# Patient Record
Sex: Female | Born: 1960
Health system: Southern US, Community
[De-identification: ages and names within clinical notes are randomized; demographics above are authoritative.]

## PROBLEM LIST (undated history)

## (undated) DIAGNOSIS — R059 Cough, unspecified: Secondary | ICD-10-CM

## (undated) DIAGNOSIS — R1110 Vomiting, unspecified: Secondary | ICD-10-CM

## (undated) DIAGNOSIS — R112 Nausea with vomiting, unspecified: Secondary | ICD-10-CM

## (undated) DIAGNOSIS — E669 Obesity, unspecified: Secondary | ICD-10-CM

## (undated) DIAGNOSIS — R6883 Chills (without fever): Secondary | ICD-10-CM

## (undated) DIAGNOSIS — R05 Cough: Secondary | ICD-10-CM

## (undated) DIAGNOSIS — F419 Anxiety disorder, unspecified: Secondary | ICD-10-CM

## (undated) DIAGNOSIS — R634 Abnormal weight loss: Secondary | ICD-10-CM

## (undated) DIAGNOSIS — R531 Weakness: Secondary | ICD-10-CM

## (undated) DIAGNOSIS — Z9889 Other specified postprocedural states: Secondary | ICD-10-CM

## (undated) HISTORY — DX: Chills (without fever): R68.83

## (undated) HISTORY — DX: Cough: R05

## (undated) HISTORY — DX: Abnormal weight loss: R63.4

## (undated) HISTORY — DX: Obesity, unspecified: E66.9

## (undated) HISTORY — DX: Cough, unspecified: R05.9

## (undated) HISTORY — DX: Weakness: R53.1

## (undated) HISTORY — DX: Anxiety disorder, unspecified: F41.9

## (undated) HISTORY — DX: Vomiting, unspecified: R11.10

## (undated) HISTORY — PX: COLONOSCOPY: SHX174

---

## 1999-05-02 ENCOUNTER — Other Ambulatory Visit: Admission: RE | Admit: 1999-05-02 | Discharge: 1999-05-02 | Payer: Self-pay | Admitting: Family Medicine

## 2001-01-13 ENCOUNTER — Other Ambulatory Visit: Admission: RE | Admit: 2001-01-13 | Discharge: 2001-01-13 | Payer: Self-pay | Admitting: Internal Medicine

## 2002-07-30 ENCOUNTER — Other Ambulatory Visit: Admission: RE | Admit: 2002-07-30 | Discharge: 2002-07-30 | Payer: Self-pay | Admitting: Obstetrics and Gynecology

## 2002-12-31 ENCOUNTER — Encounter (INDEPENDENT_AMBULATORY_CARE_PROVIDER_SITE_OTHER): Payer: Self-pay

## 2002-12-31 ENCOUNTER — Inpatient Hospital Stay (HOSPITAL_COMMUNITY): Admission: RE | Admit: 2002-12-31 | Discharge: 2003-01-02 | Payer: Self-pay | Admitting: Obstetrics and Gynecology

## 2004-01-29 HISTORY — PX: ABDOMINAL HYSTERECTOMY: SHX81

## 2004-07-13 ENCOUNTER — Other Ambulatory Visit: Admission: RE | Admit: 2004-07-13 | Discharge: 2004-07-13 | Payer: Self-pay | Admitting: Obstetrics and Gynecology

## 2006-05-22 ENCOUNTER — Other Ambulatory Visit: Admission: RE | Admit: 2006-05-22 | Discharge: 2006-05-22 | Payer: Self-pay | Admitting: Gynecology

## 2006-06-09 ENCOUNTER — Ambulatory Visit (HOSPITAL_COMMUNITY): Admission: RE | Admit: 2006-06-09 | Discharge: 2006-06-09 | Payer: Self-pay | Admitting: Obstetrics and Gynecology

## 2006-08-05 ENCOUNTER — Ambulatory Visit (HOSPITAL_COMMUNITY): Admission: RE | Admit: 2006-08-05 | Discharge: 2006-08-05 | Payer: Self-pay | Admitting: General Surgery

## 2006-08-11 ENCOUNTER — Encounter: Admission: RE | Admit: 2006-08-11 | Discharge: 2006-08-11 | Payer: Self-pay | Admitting: General Surgery

## 2006-08-28 ENCOUNTER — Ambulatory Visit (HOSPITAL_BASED_OUTPATIENT_CLINIC_OR_DEPARTMENT_OTHER): Admission: RE | Admit: 2006-08-28 | Discharge: 2006-08-28 | Payer: Self-pay | Admitting: General Surgery

## 2006-08-31 ENCOUNTER — Ambulatory Visit: Payer: Self-pay | Admitting: Internal Medicine

## 2006-11-17 ENCOUNTER — Encounter: Admission: RE | Admit: 2006-11-17 | Discharge: 2007-02-15 | Payer: Self-pay | Admitting: General Surgery

## 2006-12-02 ENCOUNTER — Ambulatory Visit (HOSPITAL_COMMUNITY): Admission: RE | Admit: 2006-12-02 | Discharge: 2006-12-03 | Payer: Self-pay | Admitting: *Deleted

## 2006-12-02 HISTORY — PX: LAPAROSCOPIC GASTRIC BANDING: SHX1100

## 2007-09-24 ENCOUNTER — Other Ambulatory Visit: Admission: RE | Admit: 2007-09-24 | Discharge: 2007-09-24 | Payer: Self-pay | Admitting: Gynecology

## 2008-11-17 ENCOUNTER — Other Ambulatory Visit: Admission: RE | Admit: 2008-11-17 | Discharge: 2008-11-17 | Payer: Self-pay | Admitting: Gynecology

## 2008-11-17 ENCOUNTER — Encounter: Payer: Self-pay | Admitting: Women's Health

## 2008-11-17 ENCOUNTER — Ambulatory Visit: Payer: Self-pay | Admitting: Women's Health

## 2009-05-08 ENCOUNTER — Ambulatory Visit (HOSPITAL_COMMUNITY): Admission: RE | Admit: 2009-05-08 | Discharge: 2009-05-08 | Payer: Self-pay | Admitting: Gynecology

## 2009-12-01 ENCOUNTER — Other Ambulatory Visit: Admission: RE | Admit: 2009-12-01 | Discharge: 2009-12-01 | Payer: Self-pay | Admitting: Gynecology

## 2009-12-01 ENCOUNTER — Ambulatory Visit: Payer: Self-pay | Admitting: Women's Health

## 2010-05-17 ENCOUNTER — Other Ambulatory Visit: Payer: Self-pay | Admitting: Women's Health

## 2010-05-17 DIAGNOSIS — Z1231 Encounter for screening mammogram for malignant neoplasm of breast: Secondary | ICD-10-CM

## 2010-06-12 NOTE — Op Note (Signed)
NAMEANGELI, [name]                ACCOUNT NO.:  [id number]   MEDICAL RECORD NO.:  [id number]          PATIENT TYPE:  OIB   LOCATION:  1233                         FACILITY:  [facility]   PHYSICIAN:  Sharlet Salina T. Hoxworth, M.D.DATE OF BIRTH:  May 06, 1960   DATE OF PROCEDURE:  12/02/2006  DATE OF DISCHARGE:                               OPERATIVE REPORT   PREOPERATIVE DIAGNOSIS:  Morbid obesity.   POSTOPERATIVE DIAGNOSES:  1. Morbid obesity.  2. Hiatal hernia.   SURGICAL PROCEDURES:  Placement of laparoscopic adjustable gastric band  with hiatal hernia repair.   SURGEON:  Lorne Skeens. Hoxworth, M.D.   ASSISTANT:  Ovidio Kin, M.D.   ANESTHESIA:  General.   BRIEF HISTORY:  Ms. Cupples is a 50 year old female with a long history  of morbid obesity unresponsive to medical management and comorbidities  of sleep apnea, hyperlipidemia and stress urinary incontinence.  After  extensive preoperative workup and discussion detailed elsewhere, we have  elected to proceed with placement of laparoscopic adjustable gastric  band.  Upper GI series indicates a very small hiatal hernia.   DESCRIPTION OF OPERATION:  The patient was brought to the operating room  and placed in the supine position on the operating table and general  endotracheal anesthesia was induced.  She was given preoperative  antibiotics.  The abdomen was widely sterilely prepped and draped.  PAS  were placed.  She received subcutaneous heparin preoperatively.  Correct  patient and procedure were verified.  Access was obtained in the left  subcostal space with an 11 mm OptiVu trocar and pneumoperitoneum  established without difficulty.  There was no evidence of trocar injury.  Under direct vision, a 15 mm trocar was placed in the right para xiphoid  area just beneath the falciform ligament, an 11 mm trocar in the right  midabdomen, an 11 mm trocar just to the left and above the umbilicus for  the camera.  Through a 5 mm subxiphoid  site the [facility] retractor was  placed.  The left lobe of the liver elevated with excellent exposure of  the upper stomach and hiatus.  Finally a 5 mm trocar was placed in the  left flank.  Initially the peritoneum at the angle of His was incised  and dissection carried bluntly down onto the left crus.  Following this,  pars flaccida was divided in avascular area and the base of the right  crus identified.  A small opening was made in the retroperitoneum at the  area of crossing fat and the finger dissector easily passed into the  space retrogastric and deployed up to the previously dissected area at  the angle of His without difficulty.  A flushed APS band system was  introduced through the 15 mm trocar and then the tubing brought back  behind the stomach with the finger dissector.  At this point the Ewald  tube was passed into the stomach and the balloon inflated to 15 mL.  It  was pulled back against the hiatus.  Initially there was some  resistance, but then it popped through the hiatus.  Elected to  go ahead  and repair her small hiatal hernia.  The peritoneum was incised along  the right crus just above the band tubing and dissection was carried  bluntly back behind the esophagus and esophagus elevated and esophageal  fat pad taken down out of the mediastinum and the left crus identified.  This was a small hiatal hernia.  A single 0 Ethibond suture was placed  between the crura posteriorly, but above the dissection plane of the  band and this was tied with a tie knot.  This appeared to close the  hiatus back down to a normal size.  The Ewald tube was again introduced  in the stomach.  The balloon inflated to 15 mL and this time it pulled  back snugly against the crural repair.  The balloon was deflated, the  Ewald tube withdrawn back into the esophagus and the band was pulled  back behind the stomach.  The Ewald tube was placed back into the  stomach and then the band was locked  without any undue tension.  The old  tube was removed.  Holding the band tubing firmly toward the patient's  feet beginning to the angle of His, the fundus of stomach was imbricated  up over the band to the small gastric pouch with three interrupted 2-0  Ethibond sutures.  The band appeared to lie in nice normal position.  There was no evidence of bleeding or trocar injury.  The Nathanson  retractor was removed.  The band tubing brought out through the right  mid abdominal site and all CO2 evacuated and trocars removed.  The right  mid abdominal incision was lengthened somewhat and dissection was  carried down with cautery to the anterior fascia which was cleared for  placement of the port.  The tubing was trimmed and attached to the port.  The port was then sutured to the anterior abdominal wall with four  interrupted 2-0 Prolene sutures and was lying nice and flat against the  fascia with the tubing curving smoothly into the abdomen.  This incision  was irrigated and hemostasis assured.  The subcu at the port site was  closed with running 3-0 Vicryl.  Skin incisions were closed with running  or interrupted 4-0 Monocryl and Dermabond.  Sponge and needle counts  were correct.  The patient was taken to recovery in good condition.      Lorne Skeens. Hoxworth, M.D.  Electronically Signed     BTH/MEDQ  D:  12/02/2006  T:  12/03/2006  Job:  [id number]

## 2010-06-12 NOTE — Procedures (Signed)
[name], [name]                ACCOUNT NO.:  [id number]   MEDICAL RECORD NO.:  [id number]          PATIENT TYPE:  OUT   LOCATION:  SLEEP CENTER                 FACILITY:  [facility]   PHYSICIAN:  Clinton D. Maple Hudson, MD, FCCP, FACPDATE OF BIRTH:  1960/03/26   DATE OF STUDY:  08/28/2006                            NOCTURNAL POLYSOMNOGRAM   REFERRING PHYSICIAN:  Sharlet Salina T. Hoxworth, M.D.   INDICATION FOR STUDY:  Hypersomnia with sleep apnea.   EPWORTH SLEEPINESS SCORE:  9/24, BMI is 51, weight 309 pounds.   MEDICATIONS:  Home medication Effexor.   The diagnostic NPSG protocol was requested.   SLEEP ARCHITECTURE:  Total sleep time 280 minutes with sleep efficiency  68%.  Stage I was 14%, stage II 66%, stage III 3%, REM 17% of total  sleep time.  Sleep latency 65 minutes, REM latency 277 minutes, awake  after sleep onset 64 minutes, arousal index 22.3.   RESPIRATORY DATA:  Diagnostic NPSG protocol.  Apnea-hypopnea index (AHI,  RDI) 24.2 obstructive events per hour, indicating moderate obstructive  sleep apnea/hypopnea syndrome.  There were 16 obstructive apneas and 97  hypopneas.  Events were positional,  strongly associated with supine  sleep position.  REM AHI 54.2.   OXYGEN DATA:  Moderate snoring with oxygen desaturation to a nadir of  83%.  Mean oxygen saturation through the study was 93% on room air.   CARDIAC DATA:  Normal sinus rhythm.   MOVEMENT-PARASOMNIA:  No significant limb jerk or movement disturbance.  Bathroom x1.   IMPRESSIONS-RECOMMENDATIONS:  1. Moderate obstructive sleep apnea/hypopnea syndrome, apnea-hypopnea      index 24.2 per hour with events mostly associated with supine sleep      position.  2. Moderate snoring with oxygen desaturation to a nadir of 83%.  3. CPAP titration was not requested.  Empiric CPAP at 10 centimeters      of water pressure would be a reasonable choice at time of surgery,      with general anesthesia, if formal CPAP titration is not  obtained.      Clinton D. Maple Hudson, MD, [facility], FACP  Diplomate, Biomedical engineer of Sleep Medicine  Electronically Signed     CDY/MEDQ  D:  08/31/2006 09:54:25  T:  08/31/2006 12:35:18  Job:  [id number]

## 2010-06-15 NOTE — H&P (Signed)
NAME:  [name], [name]                            ACCOUNT NO.:  [id number]   MEDICAL RECORD NO.:  [id number]                   PATIENT TYPE:   LOCATION:                                       FACILITY:   PHYSICIAN:  Guy Sandifer. Arleta Creek, M.D.           DATE OF BIRTH:   DATE OF ADMISSION:  12/31/2002  DATE OF DISCHARGE:                                HISTORY & PHYSICAL   CHIEF COMPLAINT:  Menorrhagia.   HISTORY OF PRESENT ILLNESS:  This patient is a 50 year old married white  female, G2, P2, who has increasingly heavy menses. She has had several  episodes of heavy, debilitating menstrual cramps followed  by the passage of  large blood clots. An ultrasound with sonohistogram in my office reveals a  uterus measuring 8.7 x 6.0 x 6.1 cm with a 6.6 mass consistent with  leiomyomata which abuts the endometrium. A 2-cm simple left ovarian cyst was  also noted in August. After a discussion of the options, the patient is  being admitted for total abdominal hysterectomy and removal of 1 or both  ovaries only if distinctly abnormal. The potential risks and complications  of the procedure have been discussed  preoperatively.   PAST MEDICAL HISTORY:  Severe cervical dysplasia, status post cold knife  conization in 1995.   PAST SURGICAL HISTORY:  As above.   FAMILY HISTORY:  Diabetes in mother and maternal grandfather. Stomach  cancer in grandmother. Heart disease in maternal grandmother. Endocrine  abnormality in mother.   SOCIAL HISTORY:  The patient denies tobacco, alcohol or drug abuse.   MEDICATIONS:  None.   ALLERGIES:  No known drug allergies.   REVIEW OF SYSTEMS:  NEUROLOGIC:  Denies headache. CARDIAC:  Denies chest  pain. PULMONARY: Denies shortness of breath. GI: Denies  recent  changes in  bowel habits.   PHYSICAL EXAMINATION:  GENERAL:  Height 5 feet, 4-3/4 inches, weight 271  pounds.  VITAL SIGNS:  Blood pressure 124/72.  HEENT:  Without thyromegaly.  LUNGS:  Clear to  auscultation.  HEART:  Regular rate and rhythm.  BACK:  Without CVA tenderness.  BREASTS:  Without masses, tracts, discharge.  ABDOMEN:  Obese, soft, nontender without masses.  PELVIC:  Vulva, vagina and cervix without lesion. Uterus is retroverted with  the examination  compromised by patient habitus. Adnexa no palpable masses.  EXTREMITIES: Neurological examination grossly within normal limits.   ASSESSMENT:  Uterine leiomyomata and secondary menorrhagia.   PLAN:  Total abdominal hysterectomy with possible bilateral salpingo-  oophorectomy.                                               Guy Sandifer Arleta Creek, M.D.    JET/MEDQ  D:  12/27/2002  T:  12/27/2002  Job:  [id number]

## 2010-06-15 NOTE — Discharge Summary (Signed)
[name], TUFTE                          ACCOUNT NO.:  [id number]   MEDICAL RECORD NO.:  [id number]                   PATIENT TYPE:  INP   LOCATION:  9311                                 FACILITY:  [facility]   PHYSICIAN:  Guy Sandifer. Arleta Creek, M.D.           DATE OF BIRTH:  06/30/1960   DATE OF ADMISSION:  12/31/2002  DATE OF DISCHARGE:  01/02/2003                                 DISCHARGE SUMMARY   ADMISSION DIAGNOSIS:  Uterine leiomyomata.   DISCHARGE DIAGNOSIS:  Uterine leiomyomata.   PROCEDURE:  On December 31, 2002 total abdominal hysterectomy.   REASON FOR ADMISSION:  The patient is a 50 year old married white female, G  2, P 2 having increasingly heavy menses.  Details dictated in History and  Physical.  She is admitted for surgical management.   HOSPITAL COURSE:  The patient is taken to the operating room and undergoes  the above procedure.  On the evening of surgery, she has good pain relief.  She has been up in a chair.  Vital signs are stable, she is afebrile with  good urine output.  On the first postoperative day, she has good pain  relief.  She is having some itching from the morphine. She has not yet  passed flatus as of the evening of January 01, 2003 but she is tolerating  liquids well and ambulating.  She remains  afebrile with white count of 9.1,  hemoglobin 11.7.  On the day of discharge, she is passing flatus,  ambulating, tolerating regular diet.  She is having itching on the Percocet.  Vital signs are stable.  Temperature is 100.9, retake was 97.5.  Lungs are  clear and incision is healing well.  Staples are removed.   CONDITION ON DISCHARGE:  Good.   DIET:  Regular as tolerated.   ACTIVITY:  No lifting, no operation of automobiles, no vaginal entry.   DISCHARGE INSTRUCTIONS:  She is to call the office for problems including  but not limited to heavy vaginal bleeding, temperature of 101 degrees,  persistent nausea and vomiting or increasing pain.  Follow  up is in the  office in two weeks.   DISCHARGE MEDICATIONS:  1. Demerol 50 mg, dispense #30, one to two p.o. q.6 h. p.r.n.  2. Ibuprofen 600 mg q.6 h. p.r.n.  3. Multivitamin daily.                                               Guy Sandifer Arleta Creek, M.D.    JET/MEDQ  D:  01/02/2003  T:  01/02/2003  Job:  [id number]

## 2010-06-15 NOTE — Op Note (Signed)
[name], [name]                          ACCOUNT NO.:  [id number]   MEDICAL RECORD NO.:  [id number]                   PATIENT TYPE:  INP   LOCATION:  9399                                 FACILITY:  [facility]   PHYSICIAN:  Guy Sandifer. Arleta Creek, M.D.           DATE OF BIRTH:  January 26, 1961   DATE OF PROCEDURE:  12/31/2002  DATE OF DISCHARGE:                                 OPERATIVE REPORT   PREOPERATIVE DIAGNOSIS:  Uterine leiomyomata.   POSTOPERATIVE DIAGNOSIS:  Uterine leiomyomata.   PROCEDURE:  Total abdominal hysterectomy.   SURGEON:  Guy Sandifer. Henderson Cloud, M.D.   ASSISTANT:  Raynald Kemp, M.D.   ANESTHESIA:  General with endotracheal intubation, Dorinda Hill T. Pamalee Leyden, M.D.   ESTIMATED BLOOD LOSS:  300 mL.   SPECIMENS:  Uterus and cervix.   INDICATIONS AND CONSENT:  This patient is a 50 year old married white  female, G2, P2, with increasingly heavy menstrual periods.  Details are  dictated in the history and physical.  Total abdominal hysterectomy and  removal of one or both ovaries only if distinctly abnormal has been  discussed.  Potential risks and complications have been reviewed, including  but not limited to infection, bowel or bladder or ureteral damage, bleeding  requiring transfusion of blood products with possible transfusion reaction,  HIV and hepatitis acquisition, DVT, PE, pneumonia, fistula formation,  postoperative dyspareunia, and postoperative pain.  All questions have been  answered and consent is signed on the chart.   FINDINGS:  The uterus has an approximately 7 cm posterior fundal fibroid.  Ovaries are normal bilaterally.   DESCRIPTION OF PROCEDURE:  The patient was taken to the operating room and  placed in the dorsal supine position, where general anesthesia is induced  via endotracheal intubation.  She is then prepped abdominally and vaginally,  a Foley catheter is placed in the bladder as a drain, and she is draped in a  sterile fashion.  The skin is  entered through a Pfannenstiel incision over  the previous scars and dissection is carried out in layers to the  peritoneum, which is extended superiorly and inferiorly.  A Balfour  retractor is placed, a bladder blade is placed, and the bowel is packed  away.  Uterus is grasped and elevated with a thyroid tenaculum.  Several  filmy adhesions of the right ovary to the uterus are taken down sharply.  Kelly clamps are placed on the proximal ligament.  The left round ligament  is then ligated with 0 Monocryl and taken down with cautery.  All suture  will be 0 Monocryl unless otherwise designated.  The anterior leaf of the  broad ligament is taken down and the posterior leaf is sharply perforated.  Proximal ligaments are crossclamped, taken down, and doubly ligated with a  free tie, then a suture.  After advancing the bladder adequately, the  uterine vessel on the left is taken down and singly ligated.  A similar  procedure is carried out on the right adnexa.  When the uterine arteries had  been taken down bilaterally, the fundus is then amputated with a scalpel  using a wide malleable as a backstop, and the fundus is delivered.  After  assuring the bladder is advanced properly, the cardinal ligaments are taken  down bilaterally and then the uterosacral ligaments are taken down and  sutured with transfixion sutures bilaterally.  The left vaginal fornix is  entered and the cervix is removed sharply. Inspection revealed the total  cervix has been removed.  The vaginal cuff is then closed with figure-of-  eights, which achieves good hemostasis.  The uterosacral ligaments are  plicated in the midline with a single stitch.  The ovaries are plicated to  the round ligament pedicles bilaterally.  Copious irrigation is carried out  until all return is clear.  Close inspection reveals good hemostasis.  Packs  are removed, retractors are removed, and the anterior peritoneum is closed  in a running fashion  with 0 Monocryl, which also is used to reapproximate  the pyramidalis muscle in the midline.  The rectus fascia is closed in a  running fashion with 0 PDS starting at each angle and meeting in the middle.  Skin is closed with clips.  All sponge, instrument, and needle counts are  correct, and the patient is awakened and taken to the recovery room in  stable condition.                                               Guy Sandifer Arleta Creek, M.D.    JET/MEDQ  D:  12/31/2002  T:  12/31/2002  Job:  [id number]

## 2010-08-28 ENCOUNTER — Telehealth: Payer: Self-pay

## 2010-08-28 NOTE — Telephone Encounter (Signed)
WANTS TO DISCUSS WITH NANCY ABOUT INCREASING EFFEXOR OR CHANGING TO ANOTHER RX. SHE DOES NOT FEEL LIKE CURRENT RX HELPING HER AT ALL ANYMORE. PT KNOWS IT WILL BE 08/29/10 BEFORE NANCY CAN CALL HER BACK & SHE IS FINE WITH THIS.

## 2010-08-28 NOTE — Telephone Encounter (Signed)
Telephone call to patient. She has done well on Effexor 75 daily for many years and has recently started taking 2 tablets which has helped. Requests a prescription for Effexor 150 xr daily. She has a 49 year old daughter who is struggling with career ,college applications and she states she is angry at times  Berniece Andreas  name and number was given for counseling, or her school counselor. Will call in Effexor  150 ER  [employer]  RX Called to [facility].

## 2010-08-29 ENCOUNTER — Ambulatory Visit (HOSPITAL_COMMUNITY)
Admission: RE | Admit: 2010-08-29 | Discharge: 2010-08-29 | Disposition: A | Discharge status: Home / Self Care | Payer: BC Managed Care – PPO | Source: Ambulatory Visit | Attending: Women's Health | Admitting: Women's Health

## 2010-08-29 DIAGNOSIS — Z1231 Encounter for screening mammogram for malignant neoplasm of breast: Secondary | ICD-10-CM

## 2010-11-06 LAB — CBC
HCT: 41
Hemoglobin: 14.7
MCHC: 34.9
MCV: 86.7
Platelets: 211
RBC: 4.64
RBC: 4.85
RDW: 12.8
RDW: 13.3
WBC: 5.3
WBC: 9.3

## 2010-11-06 LAB — URINALYSIS, ROUTINE W REFLEX MICROSCOPIC
Hgb urine dipstick: NEGATIVE
Urobilinogen, UA: 1

## 2010-11-06 LAB — DIFFERENTIAL
Basophils Relative: 0
Eosinophils Absolute: 0.1
Lymphocytes Relative: 12
Lymphocytes Relative: 37
Monocytes Relative: 7
Neutrophils Relative %: 53

## 2010-11-06 LAB — BASIC METABOLIC PANEL
Creatinine, Ser: 0.93
GFR calc non Af Amer: >60
Potassium: 4.4

## 2011-03-22 ENCOUNTER — Ambulatory Visit (INDEPENDENT_AMBULATORY_CARE_PROVIDER_SITE_OTHER): Payer: BC Managed Care – PPO | Admitting: Physician Assistant

## 2011-03-22 ENCOUNTER — Encounter (INDEPENDENT_AMBULATORY_CARE_PROVIDER_SITE_OTHER): Payer: Self-pay

## 2011-03-22 VITALS — BP 118/66 | HR 64 | Temp 97.8°F | Resp 16 | Ht 65.0 in | Wt 138.6 lb

## 2011-03-22 DIAGNOSIS — Z4651 Encounter for fitting and adjustment of gastric lap band: Secondary | ICD-10-CM

## 2011-03-22 NOTE — Progress Notes (Signed)
  HISTORY: [name] is a 51 y.o.female who received an AP-Standard lap-band in November 2008 by Dr. Johna Sheriff. Since her last visit in 2009 she has lost a remarkable 166 lbs. Her BMI is now in the normal range. Unfortunately in the past two months she has had symptoms of regurgitation and reflux every day. She attributes this to family issues that are difficult to resolve. She has some days where water is difficult to tolerate.  VITAL SIGNS: Filed Vitals:   03/22/11 1225  BP: 118/66  Pulse: 64  Temp: 97.8 F (36.6 C)  Resp: 16    PHYSICAL EXAM: Physical exam reveals a very well-appearing 51 y.o.female in no apparent distress Neurologic: Awake, alert, oriented Psych: Bright affect, conversant Respiratory: Breathing even and unlabored. No stridor or wheezing Abdomen: Soft, nontender, nondistended to palpation. Incisions well-healed. No incisional hernias. Port easily palpated. Extremities: Atraumatic, good range of motion.  ASSESMENT: 51 y.o.  female  s/p AP-Standard lap-band.   PLAN: The patient's port was accessed with a 20G Huber needle without difficulty. Clear fluid was aspirated and 0.5 mL saline was removed from the port to give a total predicted volume of 5 mL. The patient was able to swallow water without difficulty following the procedure. She felt much relief. I advised her to take omeprazole and to follow up with [place] in a month.

## 2011-03-22 NOTE — Patient Instructions (Signed)
Take omeprazole (20mg  tablets, available over-the-counter), one tablet twice daily for one week then one tablet daily. Return in one month or sooner if needed.

## 2011-03-28 DIAGNOSIS — F419 Anxiety disorder, unspecified: Secondary | ICD-10-CM | POA: Insufficient documentation

## 2011-03-28 DIAGNOSIS — E669 Obesity, unspecified: Secondary | ICD-10-CM | POA: Insufficient documentation

## 2011-04-03 ENCOUNTER — Encounter: Payer: Self-pay | Admitting: Women's Health

## 2011-04-03 ENCOUNTER — Ambulatory Visit (INDEPENDENT_AMBULATORY_CARE_PROVIDER_SITE_OTHER): Payer: BC Managed Care – PPO | Admitting: Women's Health

## 2011-04-03 VITALS — BP 118/70 | Ht 64.75 in | Wt 157.0 lb

## 2011-04-03 DIAGNOSIS — F411 Generalized anxiety disorder: Secondary | ICD-10-CM

## 2011-04-03 DIAGNOSIS — Z780 Asymptomatic menopausal state: Secondary | ICD-10-CM

## 2011-04-03 DIAGNOSIS — Z833 Family history of diabetes mellitus: Secondary | ICD-10-CM

## 2011-04-03 DIAGNOSIS — F419 Anxiety disorder, unspecified: Secondary | ICD-10-CM

## 2011-04-03 DIAGNOSIS — Z01419 Encounter for gynecological examination (general) (routine) without abnormal findings: Secondary | ICD-10-CM

## 2011-04-03 DIAGNOSIS — Z13220 Encounter for screening for lipoid disorders: Secondary | ICD-10-CM

## 2011-04-03 LAB — GLUCOSE, RANDOM: Glucose, Bld: 67 mg/dL — ABNORMAL LOW (ref 70–99)

## 2011-04-03 LAB — LIPID PANEL
Cholesterol: 170 mg/dL (ref 0–200)
LDL Cholesterol: 97 mg/dL (ref 0–99)
Total CHOL/HDL Ratio: 2.7 Ratio

## 2011-04-03 MED ORDER — VENLAFAXINE HCL 75 MG PO TABS: 75.0000 mg | ORAL_TABLET | Freq: Two times a day (BID) | ORAL | Status: DC

## 2011-04-03 NOTE — Progress Notes (Signed)
[name] [birth year] [id number]    History:    The patient presents for annual exam.  Abdominal hysterectomy in 05 for fibroids. Had lap band surgery in 2008 and has lost approximately 100 pounds and is doing well. History of normal Paps and mammograms. Currently on Effexor 150 ER daily for anxiety.   Past medical history, past surgical history, family history and social history were all reviewed and documented in the EPIC chart.   ROS:  A  ROS was performed and pertinent positives and negatives are included in the history.  Exam:  Filed Vitals:   04/03/11 1357  BP: 118/70    General appearance:  Normal Head/Neck:  Normal, without cervical or supraclavicular adenopathy. Thyroid:  Symmetrical, normal in size, without palpable masses or nodularity. Respiratory  Effort:  Normal  Auscultation:  Clear without wheezing or rhonchi Cardiovascular  Auscultation:  Regular rate, without rubs, murmurs or gallops  Edema/varicosities:  Not grossly evident Abdominal  Soft,nontender, without masses, guarding or rebound.  Liver/spleen:  No organomegaly noted  Hernia:  None appreciated  Skin  Inspection:  Grossly normal  Palpation:  Grossly normal Neurologic/psychiatric  Orientation:  Normal with appropriate conversation.  Mood/affect:  Normal  Genitourinary    Breasts: Examined lying and sitting.     Right: Without masses, retractions, discharge or axillary adenopathy.     Left: Without masses, retractions, discharge or axillary adenopathy.   Inguinal/mons:  Normal without inguinal adenopathy  External genitalia:  Normal  BUS/Urethra/Skene's glands:  Normal  Bladder:  Normal  Vagina:  Normal  Cervix:   Uterus:    Adnexa/parametria:     Rt: Without masses or tenderness.   Lt: Without masses or tenderness.  Anus and perineum: Normal  Digital rectal exam: Normal sphincter tone without palpated masses or tenderness  Assessment/Plan:  51 y.o. MWF G2 P2 for annual exam with no  complaints.    Postmenopausal/hysterectomy/no ERT Anxiety-stable on Effexor  Plan: Effexor 75 ER twice a day, prescription, proper use given and reviewed. Declines need for counseling at this time. SBE's, annual mammogram, due in April, increase regular cardiac type exercise, vitamin D 2000 daily encouraged. Schedule Dexa here. CBC, glucose, lipid profile, UA. Instructed to schedule colonoscopy, but to inform office history of lap band for prep purposes.    Harrington Challenger [facility], 3:42 PM 04/03/2011

## 2011-04-03 NOTE — Patient Instructions (Signed)
Schedule colonoscopy and dexaHealth Maintenance, Females A healthy lifestyle and preventative care can promote health and wellness.  Maintain regular health, dental, and eye exams.   Eat a healthy diet. Foods like vegetables, fruits, whole grains, low-fat dairy products, and lean protein foods contain the nutrients you need without too many calories. Decrease your intake of foods high in solid fats, added sugars, and salt. Get information about a proper diet from your caregiver, if necessary.   Regular physical exercise is one of the most important things you can do for your health. Most adults should get at least 150 minutes of moderate-intensity exercise (any activity that increases your heart rate and causes you to sweat) each week. In addition, most adults need muscle-strengthening exercises on 2 or more days a week.    Maintain a healthy weight. The body mass index (BMI) is a screening tool to identify possible weight problems. It provides an estimate of body fat based on height and weight. Your caregiver can help determine your BMI, and can help you achieve or maintain a healthy weight. For adults 20 years and older:   A BMI below 18.5 is considered underweight.   A BMI of 18.5 to 24.9 is normal.   A BMI of 25 to 29.9 is considered overweight.   A BMI of 30 and above is considered obese.   Maintain normal blood lipids and cholesterol by exercising and minimizing your intake of saturated fat. Eat a balanced diet with plenty of fruits and vegetables. Blood tests for lipids and cholesterol should begin at age 43 and be repeated every 5 years. If your lipid or cholesterol levels are high, you are over 50, or you are a high risk for heart disease, you may need your cholesterol levels checked more frequently.Ongoing high lipid and cholesterol levels should be treated with medicines if diet and exercise are not effective.   If you smoke, find out from your caregiver how to quit. If you do not  use tobacco, do not start.   If you are pregnant, do not drink alcohol. If you are breastfeeding, be very cautious about drinking alcohol. If you are not pregnant and choose to drink alcohol, do not exceed 1 drink per day. One drink is considered to be 12 ounces (355 mL) of beer, 5 ounces (148 mL) of wine, or 1.5 ounces (44 mL) of liquor.   Avoid use of street drugs. Do not share needles with anyone. Ask for help if you need support or instructions about stopping the use of drugs.   High blood pressure causes heart disease and increases the risk of stroke. Blood pressure should be checked at least every 1 to 2 years. Ongoing high blood pressure should be treated with medicines, if weight loss and exercise are not effective.   If you are 36 to 50 years old, ask your caregiver if you should take aspirin to prevent strokes.   Diabetes screening involves taking a blood sample to check your fasting blood sugar level. This should be done once every 3 years, after age 63, if you are within normal weight and without risk factors for diabetes. Testing should be considered at a younger age or be carried out more frequently if you are overweight and have at least 1 risk factor for diabetes.   Breast cancer screening is essential preventative care for women. You should practice "breast self-awareness." This means understanding the normal appearance and feel of your breasts and may include breast self-examination. Any changes detected,  no matter how small, should be reported to a caregiver. Women in their 54s and 30s should have a clinical breast exam (CBE) by a caregiver as part of a regular health exam every 1 to 3 years. After age 51, women should have a CBE every year. Starting at age 45, women should consider having a mammogram (breast X-ray) every year. Women who have a family history of breast cancer should talk to their caregiver about genetic screening. Women at a high risk of breast cancer should talk to  their caregiver about having an MRI and a mammogram every year.   The Pap test is a screening test for cervical cancer. Women should have a Pap test starting at age 29. Between ages 35 and 50, Pap tests should be repeated every 2 years. Beginning at age 42, you should have a Pap test every 3 years as long as the past 3 Pap tests have been normal. If you had a hysterectomy for a problem that was not cancer or a condition that could lead to cancer, then you no longer need Pap tests. If you are between ages 80 and 89, and you have had normal Pap tests going back 10 years, you no longer need Pap tests. If you have had past treatment for cervical cancer or a condition that could lead to cancer, you need Pap tests and screening for cancer for at least 20 years after your treatment. If Pap tests have been discontinued, risk factors (such as a new sexual partner) need to be reassessed to determine if screening should be resumed. Some women have medical problems that increase the chance of getting cervical cancer. In these cases, your caregiver may recommend more frequent screening and Pap tests.   The human papillomavirus (HPV) test is an additional test that may be used for cervical cancer screening. The HPV test looks for the virus that can cause the cell changes on the cervix. The cells collected during the Pap test can be tested for HPV. The HPV test could be used to screen women aged 1 years and older, and should be used in women of any age who have unclear Pap test results. After the age of 69, women should have HPV testing at the same frequency as a Pap test.   Colorectal cancer can be detected and often prevented. Most routine colorectal cancer screening begins at the age of 74 and continues through age 73. However, your caregiver may recommend screening at an earlier age if you have risk factors for colon cancer. On a yearly basis, your caregiver may provide home test kits to check for hidden blood in the  stool. Use of a small camera at the end of a tube, to directly examine the colon (sigmoidoscopy or colonoscopy), can detect the earliest forms of colorectal cancer. Talk to your caregiver about this at age 75, when routine screening begins. Direct examination of the colon should be repeated every 5 to 10 years through age 53, unless early forms of pre-cancerous polyps or small growths are found.   Hepatitis C blood testing is recommended for all people born from [birth year] through 1964 and any individual with known risks for hepatitis C.   Practice safe sex. Use condoms and avoid high-risk sexual practices to reduce the spread of sexually transmitted infections (STIs). Sexually active women aged 56 and younger should be checked for Chlamydia, which is a common sexually transmitted infection. Older women with new or multiple partners should also be tested for Chlamydia.  Testing for other STIs is recommended if you are sexually active and at increased risk.   Osteoporosis is a disease in which the bones lose minerals and strength with aging. This can result in serious bone fractures. The risk of osteoporosis can be identified using a bone density scan. Women ages 55 and over and women at risk for fractures or osteoporosis should discuss screening with their caregivers. Ask your caregiver whether you should be taking a calcium supplement or vitamin D to reduce the rate of osteoporosis.   Menopause can be associated with physical symptoms and risks. Hormone replacement therapy is available to decrease symptoms and risks. You should talk to your caregiver about whether hormone replacement therapy is right for you.   Use sunscreen with a sun protection factor (SPF) of 30 or greater. Apply sunscreen liberally and repeatedly throughout the day. You should seek shade when your shadow is shorter than you. Protect yourself by wearing long sleeves, pants, a wide-brimmed hat, and sunglasses year round, whenever you are  outdoors.   Notify your caregiver of new moles or changes in moles, especially if there is a change in shape or color. Also notify your caregiver if a mole is larger than the size of a pencil eraser.   Stay current with your immunizations.  Document Released: 07/30/2010 Document Revised: 01/03/2011 Document Reviewed: 07/30/2010 [facility] Patient Information 2012 [place], [place].

## 2011-04-04 ENCOUNTER — Telehealth: Payer: Self-pay | Admitting: *Deleted

## 2011-04-04 LAB — CBC WITH DIFFERENTIAL/PLATELET
HCT: 38.2 % (ref 36.0–46.0)
Hemoglobin: 12.2 g/dL (ref 12.0–15.0)
Lymphocytes Relative: 41 % (ref 12–46)
MCH: 28 pg (ref 26.0–34.0)
MCHC: 31.9 g/dL (ref 30.0–36.0)
Neutro Abs: 2.5 10*3/uL (ref 1.7–7.7)
Platelets: 194 10*3/uL (ref 150–400)
RBC: 4.36 MIL/uL (ref 3.87–5.11)
RDW: 15.1 % (ref 11.5–15.5)
WBC: 4.8 10*3/uL (ref 4.0–10.5)

## 2011-04-04 LAB — URINALYSIS W MICROSCOPIC + REFLEX CULTURE
Bacteria, UA: NONE SEEN
Casts: NONE SEEN
Crystals: NONE SEEN
Hgb urine dipstick: NEGATIVE
Leukocytes, UA: NEGATIVE
Protein, ur: NEGATIVE mg/dL
Specific Gravity, Urine: 1.015 (ref 1.005–1.030)
Squamous Epithelial / LPF: NONE SEEN

## 2011-04-04 MED ORDER — VENLAFAXINE HCL ER 75 MG PO CP24: 75.0000 mg | ORAL_CAPSULE | Freq: Two times a day (BID) | ORAL | Status: DC

## 2011-04-04 NOTE — Telephone Encounter (Signed)
Pharmacy Nedra Hai at [facility] called starting rx was sent incorrect.  Per OV note rx should be effexor er 75 mg 1 by month twice daily # 60

## 2011-04-05 ENCOUNTER — Telehealth: Payer: Self-pay | Admitting: *Deleted

## 2011-04-08 ENCOUNTER — Telehealth: Payer: Self-pay | Admitting: *Deleted

## 2011-04-08 NOTE — Telephone Encounter (Signed)
Please call patient and ask if she has to meet a deductible  for her medications. Ask if she has a list of preferred medications to choose from. Let her know I called a psychologist friend  to ask what may be a good substitute - I'm waiting for a return call.

## 2011-04-08 NOTE — Telephone Encounter (Signed)
Left message for pt to call regarding the below. 

## 2011-04-08 NOTE — Telephone Encounter (Signed)
Pt left message stating effexor too expensive? Left message for pt to call.

## 2011-04-08 NOTE — Telephone Encounter (Signed)
Pt called stating her insurance plan has changed and her generic Effexor will be $65.00 a month. Pt is requesting something similar to this medication if possible. Please advise

## 2011-04-10 ENCOUNTER — Other Ambulatory Visit: Payer: Self-pay | Admitting: Women's Health

## 2011-04-10 DIAGNOSIS — F419 Anxiety disorder, unspecified: Secondary | ICD-10-CM

## 2011-04-10 MED ORDER — VENLAFAXINE HCL 75 MG PO TABS: 75.0000 mg | ORAL_TABLET | Freq: Two times a day (BID) | ORAL | Status: DC

## 2011-04-10 NOTE — Telephone Encounter (Signed)
Telephone call to review requests. States would like to have Effexor 75 ER twice a day called in again. She had been on a capsule not generic and would like  generic due to cost

## 2011-05-01 ENCOUNTER — Telehealth (INDEPENDENT_AMBULATORY_CARE_PROVIDER_SITE_OTHER): Payer: Self-pay | Admitting: General Surgery

## 2011-05-01 NOTE — Telephone Encounter (Signed)
05/01/11 mailed recall letter to patient for 1 month follow-up for lap band surgery. (Per last office note in [facility] 03/22/11, pt to return in 1 month). Adv patient to call [facility] @ [phone] to schedule an appointment. cef

## 2011-05-02 ENCOUNTER — Encounter (INDEPENDENT_AMBULATORY_CARE_PROVIDER_SITE_OTHER): Payer: BC Managed Care – PPO

## 2011-08-15 ENCOUNTER — Encounter: Payer: Self-pay | Admitting: *Deleted

## 2011-08-15 NOTE — Progress Notes (Signed)
Patient ID: [name], female   DOB: [birth year], 51 y.o.   MRN: [id number] Pt called requesting 90 day supply Effexor 75 mg. rx called into mail order pharmacy [phone]

## 2012-05-08 ENCOUNTER — Telehealth (INDEPENDENT_AMBULATORY_CARE_PROVIDER_SITE_OTHER): Payer: Self-pay

## 2012-05-08 NOTE — Telephone Encounter (Signed)
Returned patient's call and made appt 4/17 at 12:30 with Mardelle Matte

## 2012-05-08 NOTE — Telephone Encounter (Signed)
Pt calling to be seen.  She has gained quite a bit of weight, and would like to discuss this with the doctor.  Appt was made with Bary Richard, PA.

## 2012-05-08 NOTE — Telephone Encounter (Signed)
Please call pt back on her cell phone. [phone].

## 2012-05-14 ENCOUNTER — Encounter (INDEPENDENT_AMBULATORY_CARE_PROVIDER_SITE_OTHER): Payer: Self-pay

## 2012-05-14 ENCOUNTER — Ambulatory Visit (INDEPENDENT_AMBULATORY_CARE_PROVIDER_SITE_OTHER): Payer: BC Managed Care – PPO | Admitting: Physician Assistant

## 2012-05-14 VITALS — BP 114/72 | HR 73 | Resp 16 | Ht 65.0 in | Wt 210.6 lb

## 2012-05-14 DIAGNOSIS — Z4651 Encounter for fitting and adjustment of gastric lap band: Secondary | ICD-10-CM

## 2012-05-14 NOTE — Progress Notes (Signed)
  HISTORY: [name] is a 52 y.o.female who received an AP-Standard lap-band in November 2008 by Dr. Johna Sheriff. Her last visit was in February 2013 when I removed 0.5 mL fluid for reflux/regurgiation. Unfortunately this is the first time she's been back to see [place] and as a result, has gained 72 lbs. She describes hunger and large portion sizes as if the band wasn't even there. She has no further regurgitation or reflux. She would like a fill today to get back on track.  VITAL SIGNS: Filed Vitals:   05/14/12 1259  BP: 114/72  Pulse: 73  Resp: 16    PHYSICAL EXAM: Physical exam reveals a very well-appearing 52 y.o.female in no apparent distress Neurologic: Awake, alert, oriented Psych: Bright affect, conversant Respiratory: Breathing even and unlabored. No stridor or wheezing Abdomen: Soft, nontender, nondistended to palpation. Incisions well-healed. No incisional hernias. Port easily palpated. Extremities: Atraumatic, good range of motion.  ASSESMENT: 52 y.o.  female  s/p AP-Standard lap-band.   PLAN: The patient's port was accessed with a 20G Huber needle without difficulty. Clear fluid was aspirated and 0.25 mL saline was added to the port to give a total predicted volume of 5.25 mL. The patient was able to swallow water without difficulty following the procedure and was instructed to take clear liquids for the next 24-48 hours and advance slowly as tolerated. I asked her to return in six weeks without fail. She voiced understanding and agreement.

## 2012-05-14 NOTE — Patient Instructions (Signed)
Take clear liquids tonight. Thin protein shakes are ok to start tomorrow morning. Slowly advance your diet thereafter. Call us if you have persistent vomiting or regurgitation, night cough or reflux symptoms. Return as scheduled or sooner if you notice no changes in hunger/portion sizes.  

## 2012-06-25 ENCOUNTER — Encounter (INDEPENDENT_AMBULATORY_CARE_PROVIDER_SITE_OTHER): Payer: BC Managed Care – PPO

## 2012-07-01 ENCOUNTER — Encounter (INDEPENDENT_AMBULATORY_CARE_PROVIDER_SITE_OTHER): Payer: Self-pay | Admitting: Physician Assistant

## 2012-10-07 ENCOUNTER — Other Ambulatory Visit: Payer: Self-pay

## 2012-10-07 DIAGNOSIS — F419 Anxiety disorder, unspecified: Secondary | ICD-10-CM

## 2013-11-15 ENCOUNTER — Other Ambulatory Visit: Payer: Self-pay | Admitting: Women's Health

## 2013-11-15 DIAGNOSIS — Z1231 Encounter for screening mammogram for malignant neoplasm of breast: Secondary | ICD-10-CM

## 2013-11-22 ENCOUNTER — Ambulatory Visit (HOSPITAL_COMMUNITY)
Admission: RE | Admit: 2013-11-22 | Discharge: 2013-11-22 | Disposition: A | Discharge status: Home / Self Care | Payer: BC Managed Care – PPO | Source: Ambulatory Visit | Attending: Women's Health | Admitting: Women's Health

## 2013-11-22 DIAGNOSIS — Z1231 Encounter for screening mammogram for malignant neoplasm of breast: Secondary | ICD-10-CM | POA: Diagnosis not present

## 2013-11-29 ENCOUNTER — Encounter (INDEPENDENT_AMBULATORY_CARE_PROVIDER_SITE_OTHER): Payer: Self-pay

## 2014-12-12 ENCOUNTER — Other Ambulatory Visit: Payer: Self-pay

## 2014-12-12 DIAGNOSIS — Z1231 Encounter for screening mammogram for malignant neoplasm of breast: Secondary | ICD-10-CM

## 2015-01-02 ENCOUNTER — Ambulatory Visit
Admission: RE | Admit: 2015-01-02 | Discharge: 2015-01-02 | Disposition: A | Discharge status: Home / Self Care | Payer: BLUE CROSS/BLUE SHIELD | Source: Ambulatory Visit

## 2015-01-02 DIAGNOSIS — Z1231 Encounter for screening mammogram for malignant neoplasm of breast: Secondary | ICD-10-CM

## 2015-07-19 ENCOUNTER — Ambulatory Visit: Payer: Self-pay | Admitting: Women's Health

## 2016-06-10 ENCOUNTER — Encounter: Payer: Self-pay | Admitting: Women's Health

## 2016-06-10 ENCOUNTER — Ambulatory Visit (INDEPENDENT_AMBULATORY_CARE_PROVIDER_SITE_OTHER): Payer: BLUE CROSS/BLUE SHIELD | Admitting: Women's Health

## 2016-06-10 VITALS — BP 126/78 | Ht 65.0 in | Wt 210.0 lb

## 2016-06-10 DIAGNOSIS — Z23 Encounter for immunization: Secondary | ICD-10-CM | POA: Diagnosis not present

## 2016-06-10 DIAGNOSIS — Z01419 Encounter for gynecological examination (general) (routine) without abnormal findings: Secondary | ICD-10-CM

## 2016-06-10 DIAGNOSIS — Z13820 Encounter for screening for osteoporosis: Secondary | ICD-10-CM

## 2016-06-10 DIAGNOSIS — F419 Anxiety disorder, unspecified: Secondary | ICD-10-CM

## 2016-06-10 DIAGNOSIS — Z13220 Encounter for screening for lipoid disorders: Secondary | ICD-10-CM

## 2016-06-10 LAB — CBC WITH DIFFERENTIAL/PLATELET
BASOS ABS: 0 {cells}/uL (ref 0–200)
Basophils Relative: 0 %
EOS ABS: 0 {cells}/uL — AB (ref 15–500)
Eosinophils Relative: 0 %
HCT: 44.2 % (ref 35.0–45.0)
Hemoglobin: 14.2 g/dL (ref 11.7–15.5)
LYMPHS PCT: 10 %
Lymphs Abs: 1600 cells/uL (ref 850–3900)
MCH: 27.6 pg (ref 27.0–33.0)
MCHC: 32.1 g/dL (ref 32.0–36.0)
MCV: 86 fL (ref 80.0–100.0)
MONOS PCT: 5 %
MPV: 9.4 fL (ref 7.5–12.5)
Monocytes Absolute: 800 cells/uL (ref 200–950)
NEUTROS PCT: 85 %
Neutro Abs: 13600 cells/uL — ABNORMAL HIGH (ref 1500–7800)
PLATELETS: 219 10*3/uL (ref 140–400)
RBC: 5.14 MIL/uL — ABNORMAL HIGH (ref 3.80–5.10)
RDW: 13.8 % (ref 11.0–15.0)
WBC: 16 10*3/uL — ABNORMAL HIGH (ref 3.8–10.8)

## 2016-06-10 MED ORDER — VENLAFAXINE HCL 75 MG PO TABS: ORAL_TABLET | ORAL | 4 refills | Status: DC

## 2016-06-10 NOTE — Progress Notes (Signed)
EIMAN MARET 07/20/1960 [id number]    History:    Presents for annual exam.  2005 TAH for fibroids. 2008 lap band had lost >100 pounds, gained 50 of it back. Normal Pap and mammogram history. Had been on Effexor in the past would like to restart. Has not had a colonoscopy. Last annual 2013.  Past medical history, past surgical history, family history and social history were all reviewed and documented in the EPIC chart. Freight forwarder at [employer]. 2 daughters, 2 grandchildren all doing well.  ROS:  A ROS was performed and pertinent positives and negatives are included.  Exam:  Vitals:   06/10/16 1410  Weight: 210 lb (95.3 kg)  Height: 5\' 5"  (1.651 m)   Body mass index is 34.95 kg/m.   General appearance:  Normal Thyroid:  Symmetrical, normal in size, without palpable masses or nodularity. Respiratory  Auscultation:  Clear without wheezing or rhonchi Cardiovascular  Auscultation:  Regular rate, without rubs, murmurs or gallops  Edema/varicosities:  Not grossly evident Abdominal  Soft,nontender, without masses, guarding or rebound.  Liver/spleen:  No organomegaly noted  Hernia:  None appreciated  Skin  Inspection:  Grossly normal   Breasts: Examined lying and sitting.     Right: Without masses, retractions, discharge or axillary adenopathy.     Left: Without masses, retractions, discharge or axillary adenopathy. Gentitourinary   Inguinal/mons:  Normal without inguinal adenopathy  External genitalia:  Normal  BUS/Urethra/Skene's glands:  Normal  Vagina:  Normal  Cervix: Absent Uterus: Absent.    Adnexa/parametria:     Rt: Without masses or tenderness.   Lt: Without masses or tenderness.  Anus and perineum: Normal  Digital rectal exam: Normal sphincter tone without palpated masses or tenderness  Assessment/Plan:  56 y.o. MWF G2 P2  for annual exam with no complaints.  2005 TAH for fibroids no HRT History of anxiety and would like to restart Effexor 2008 lap band surgery  weight down greater than 50 pounds  Plan: Screening colonoscopy reviewed, [facility] information given instructed to schedule. SBE's, annual screening mammogram, breast center information given instructed to schedule. Healthy diet and exercise reviewed and encouraged. Vitamin D 2000 daily encouraged. DEXA. Effexor 75 mg by mouth daily will start with half tablet for several weeks increase to full tablet. Reviewed importance of leisure activities, exercise, and counseling as needed. CBC, CMP, lipid panel, Tdap Given    Huel Cote [facility], 2:17 PM 06/10/2016

## 2016-06-10 NOTE — Patient Instructions (Addendum)
Breast center [phone]  For mammogram   [facility]  Dr Carlean Purl [employer] Maintenance for Postmenopausal Women Menopause is a normal process in which your reproductive ability comes to an end. This process happens gradually over a span of months to years, usually between the ages of 10 and 27. Menopause is complete when you have missed 12 consecutive menstrual periods. It is important to talk with your health care provider about some of the most common conditions that affect postmenopausal women, such as heart disease, cancer, and bone loss (osteoporosis). Adopting a healthy lifestyle and getting preventive care can help to promote your health and wellness. Those actions can also lower your chances of developing some of these common conditions. What should I know about menopause? During menopause, you may experience a number of symptoms, such as:  Moderate-to-severe hot flashes.  Night sweats.  Decrease in sex drive.  Mood swings.  Headaches.  Tiredness.  Irritability.  Memory problems.  Insomnia. Choosing to treat or not to treat menopausal changes is an individual decision that you make with your health care provider. What should I know about hormone replacement therapy and supplements? Hormone therapy products are effective for treating symptoms that are associated with menopause, such as hot flashes and night sweats. Hormone replacement carries certain risks, especially as you become older. If you are thinking about using estrogen or estrogen with progestin treatments, discuss the benefits and risks with your health care provider. What should I know about heart disease and stroke? Heart disease, heart attack, and stroke become more likely as you age. This may be due, in part, to the hormonal changes that your body experiences during menopause. These can affect how your body processes dietary fats, triglycerides, and cholesterol. Heart attack and stroke are both medical  emergencies. There are many things that you can do to help prevent heart disease and stroke:  Have your blood pressure checked at least every 1-2 years. High blood pressure causes heart disease and increases the risk of stroke.  If you are 59-18 years old, ask your health care provider if you should take aspirin to prevent a heart attack or a stroke.  Do not use any tobacco products, including cigarettes, chewing tobacco, or electronic cigarettes. If you need help quitting, ask your health care provider.  It is important to eat a healthy diet and maintain a healthy weight.  Be sure to include plenty of vegetables, fruits, low-fat dairy products, and lean protein.  Avoid eating foods that are high in solid fats, added sugars, or salt (sodium).  Get regular exercise. This is one of the most important things that you can do for your health.  Try to exercise for at least 150 minutes each week. The type of exercise that you do should increase your heart rate and make you sweat. This is known as moderate-intensity exercise.  Try to do strengthening exercises at least twice each week. Do these in addition to the moderate-intensity exercise.  Know your numbers.Ask your health care provider to check your cholesterol and your blood glucose. Continue to have your blood tested as directed by your health care provider. What should I know about cancer screening? There are several types of cancer. Take the following steps to reduce your risk and to catch any cancer development as early as possible. Breast Cancer  Practice breast self-awareness.  This means understanding how your breasts normally appear and feel.  It also means doing regular breast self-exams. Let your health  care provider know about any changes, no matter how small.  If you are 38 or older, have a clinician do a breast exam (clinical breast exam or CBE) every year. Depending on your age, family history, and medical history, it may  be recommended that you also have a yearly breast X-ray (mammogram).  If you have a family history of breast cancer, talk with your health care provider about genetic screening.  If you are at high risk for breast cancer, talk with your health care provider about having an MRI and a mammogram every year.  Breast cancer (BRCA) gene test is recommended for women who have family members with BRCA-related cancers. Results of the assessment will determine the need for genetic counseling and BRCA1 and for BRCA2 testing. BRCA-related cancers include these types:  Breast. This occurs in males or females.  Ovarian.  Tubal. This may also be called fallopian tube cancer.  Cancer of the abdominal or pelvic lining (peritoneal cancer).  Prostate.  Pancreatic. Cervical, Uterine, and Ovarian Cancer  Your health care provider may recommend that you be screened regularly for cancer of the pelvic organs. These include your ovaries, uterus, and vagina. This screening involves a pelvic exam, which includes checking for microscopic changes to the surface of your cervix (Pap test).  For women ages 21-65, health care providers may recommend a pelvic exam and a Pap test every three years. For women ages 51-65, they may recommend the Pap test and pelvic exam, combined with testing for human papilloma virus (HPV), every five years. Some types of HPV increase your risk of cervical cancer. Testing for HPV may also be done on women of any age who have unclear Pap test results.  Other health care providers may not recommend any screening for nonpregnant women who are considered low risk for pelvic cancer and have no symptoms. Ask your health care provider if a screening pelvic exam is right for you.  If you have had past treatment for cervical cancer or a condition that could lead to cancer, you need Pap tests and screening for cancer for at least 20 years after your treatment. If Pap tests have been discontinued for  you, your risk factors (such as having a new sexual partner) need to be reassessed to determine if you should start having screenings again. Some women have medical problems that increase the chance of getting cervical cancer. In these cases, your health care provider may recommend that you have screening and Pap tests more often.  If you have a family history of uterine cancer or ovarian cancer, talk with your health care provider about genetic screening.  If you have vaginal bleeding after reaching menopause, tell your health care provider.  There are currently no reliable tests available to screen for ovarian cancer. Lung Cancer  Lung cancer screening is recommended for adults 68-67 years old who are at high risk for lung cancer because of a history of smoking. A yearly low-dose CT scan of the lungs is recommended if you:  Currently smoke.  Have a history of at least 30 pack-years of smoking and you currently smoke or have quit within the past 15 years. A pack-year is smoking an average of one pack of cigarettes per day for one year. Yearly screening should:  Continue until it has been 15 years since you quit.  Stop if you develop a health problem that would prevent you from having lung cancer treatment. Colorectal Cancer  This type of cancer can be detected  and can often be prevented.  Routine colorectal cancer screening usually begins at age 37 and continues through age 46.  If you have risk factors for colon cancer, your health care provider may recommend that you be screened at an earlier age.  If you have a family history of colorectal cancer, talk with your health care provider about genetic screening.  Your health care provider may also recommend using home test kits to check for hidden blood in your stool.  A small camera at the end of a tube can be used to examine your colon directly (sigmoidoscopy or colonoscopy). This is done to check for the earliest forms of colorectal  cancer.  Direct examination of the colon should be repeated every 5-10 years until age 73. However, if early forms of precancerous polyps or small growths are found or if you have a family history or genetic risk for colorectal cancer, you may need to be screened more often. Skin Cancer  Check your skin from head to toe regularly.  Monitor any moles. Be sure to tell your health care provider:  About any new moles or changes in moles, especially if there is a change in a mole's shape or color.  If you have a mole that is larger than the size of a pencil eraser.  If any of your family members has a history of skin cancer, especially at a [place] age, talk with your health care provider about genetic screening.  Always use sunscreen. Apply sunscreen liberally and repeatedly throughout the day.  Whenever you are outside, protect yourself by wearing long sleeves, pants, a wide-brimmed hat, and sunglasses. What should I know about osteoporosis? Osteoporosis is a condition in which bone destruction happens more quickly than new bone creation. After menopause, you may be at an increased risk for osteoporosis. To help prevent osteoporosis or the bone fractures that can happen because of osteoporosis, the following is recommended:  If you are 59-58 years old, get at least 1,000 mg of calcium and at least 600 mg of vitamin D per day.  If you are older than age 91 but younger than age 78, get at least 1,200 mg of calcium and at least 600 mg of vitamin D per day.  If you are older than age 71, get at least 1,200 mg of calcium and at least 800 mg of vitamin D per day. Smoking and excessive alcohol intake increase the risk of osteoporosis. Eat foods that are rich in calcium and vitamin D, and do weight-bearing exercises several times each week as directed by your health care provider. What should I know about how menopause affects my mental health? Depression may occur at any age, but it is more common as  you become older. Common symptoms of depression include:  Low or sad mood.  Changes in sleep patterns.  Changes in appetite or eating patterns.  Feeling an overall lack of motivation or enjoyment of activities that you previously enjoyed.  Frequent crying spells. Talk with your health care provider if you think that you are experiencing depression. What should I know about immunizations? It is important that you get and maintain your immunizations. These include:  Tetanus, diphtheria, and pertussis (Tdap) booster vaccine.  Influenza every year before the flu season begins.  Pneumonia vaccine.  Shingles vaccine. Your health care provider may also recommend other immunizations. This information is not intended to replace advice given to you by your health care provider. Make sure you discuss any questions you have  with your health care provider. Document Released: 03/08/2005 Document Revised: 08/04/2015 Document Reviewed: 10/18/2014 Elsevier Interactive Patient Education  2017 [employer].

## 2016-06-11 ENCOUNTER — Other Ambulatory Visit: Payer: Self-pay | Admitting: Women's Health

## 2016-06-11 ENCOUNTER — Telehealth: Payer: Self-pay | Admitting: *Deleted

## 2016-06-11 ENCOUNTER — Encounter: Payer: Self-pay | Admitting: Internal Medicine

## 2016-06-11 DIAGNOSIS — D72828 Other elevated white blood cell count: Secondary | ICD-10-CM

## 2016-06-11 DIAGNOSIS — Z780 Asymptomatic menopausal state: Secondary | ICD-10-CM

## 2016-06-11 LAB — COMPREHENSIVE METABOLIC PANEL
ALK PHOS: 57 U/L (ref 33–130)
ALT: 10 U/L (ref 6–29)
AST: 13 U/L (ref 10–35)
Albumin: 3.9 g/dL (ref 3.6–5.1)
BUN: 11 mg/dL (ref 7–25)
CHLORIDE: 104 mmol/L (ref 98–110)
CO2: 24 mmol/L (ref 20–31)
Calcium: 8.8 mg/dL (ref 8.6–10.4)
Creat: 0.85 mg/dL (ref 0.50–1.05)
Glucose, Bld: 91 mg/dL (ref 65–99)
POTASSIUM: 4.3 mmol/L (ref 3.5–5.3)
Sodium: 139 mmol/L (ref 135–146)
TOTAL PROTEIN: 6.4 g/dL (ref 6.1–8.1)
Total Bilirubin: 0.9 mg/dL (ref 0.2–1.2)

## 2016-06-11 LAB — LIPID PANEL
CHOL/HDL RATIO: 2.9 ratio (ref <5.0)
CHOLESTEROL: 148 mg/dL (ref <200)
HDL: 51 mg/dL (ref >50)
LDL Cholesterol: 87 mg/dL (ref <100)
Triglycerides: 50 mg/dL (ref <150)
VLDL: 10 mg/dL (ref <30)

## 2016-06-11 NOTE — Telephone Encounter (Signed)
[name] from breast center stating that the bone density diagnosis that was placed is not covered by her insurance. I checked with [name] and was informed to change code to postmenopausal

## 2016-06-18 ENCOUNTER — Telehealth: Payer: Self-pay | Admitting: *Deleted

## 2016-06-18 DIAGNOSIS — F419 Anxiety disorder, unspecified: Secondary | ICD-10-CM

## 2016-06-18 MED ORDER — VENLAFAXINE HCL 75 MG PO TABS: ORAL_TABLET | ORAL | 4 refills | Status: DC

## 2016-06-18 NOTE — Telephone Encounter (Signed)
Pt called requesting Effexor 75 mg be sent to mail order pharmacy. Rx sent.

## 2016-07-08 ENCOUNTER — Other Ambulatory Visit: Payer: Self-pay | Admitting: Women's Health

## 2016-07-08 DIAGNOSIS — Z1231 Encounter for screening mammogram for malignant neoplasm of breast: Secondary | ICD-10-CM

## 2016-07-29 ENCOUNTER — Ambulatory Visit (AMBULATORY_SURGERY_CENTER): Payer: Self-pay | Admitting: *Deleted

## 2016-07-29 ENCOUNTER — Encounter: Payer: Self-pay | Admitting: Internal Medicine

## 2016-07-29 VITALS — Ht 64.0 in | Wt 208.0 lb

## 2016-07-29 DIAGNOSIS — Z1211 Encounter for screening for malignant neoplasm of colon: Secondary | ICD-10-CM

## 2016-07-29 NOTE — Progress Notes (Signed)
No egg or soy allergy  No home oxygen used or hx of sleep apnea  Registered in [place]  No intubation or anesthesia problems per pt  No diet medications taken

## 2016-07-29 NOTE — Addendum Note (Signed)
Addended by: Randall Hiss B on: 07/29/2016 11:05 AM   Modules accepted: Level of Service

## 2016-08-12 ENCOUNTER — Encounter: Payer: Self-pay | Admitting: Internal Medicine

## 2016-08-12 ENCOUNTER — Ambulatory Visit (AMBULATORY_SURGERY_CENTER): Payer: BLUE CROSS/BLUE SHIELD | Admitting: Internal Medicine

## 2016-08-12 VITALS — BP 114/55 | HR 61 | Temp 96.2°F | Resp 20 | Ht 64.0 in | Wt 208.0 lb

## 2016-08-12 DIAGNOSIS — Z1212 Encounter for screening for malignant neoplasm of rectum: Secondary | ICD-10-CM | POA: Diagnosis not present

## 2016-08-12 DIAGNOSIS — K6389 Other specified diseases of intestine: Secondary | ICD-10-CM | POA: Diagnosis not present

## 2016-08-12 DIAGNOSIS — D122 Benign neoplasm of ascending colon: Secondary | ICD-10-CM

## 2016-08-12 DIAGNOSIS — Z1211 Encounter for screening for malignant neoplasm of colon: Secondary | ICD-10-CM

## 2016-08-12 MED ORDER — SODIUM CHLORIDE 0.9 % IV SOLN: 500.0000 mL | INTRAVENOUS | Status: AC

## 2016-08-12 NOTE — Progress Notes (Signed)
Kristen Moore PA-S present for procedure 

## 2016-08-12 NOTE — Patient Instructions (Addendum)
   I found and removed one tiny polyp. I will let you know pathology results and when to have another routine colonoscopy by mail and/or My Chart.  I appreciate the opportunity to care for you. Gatha Mayer, MD, FACG YOU HAD AN ENDOSCOPIC PROCEDURE TODAY AT [facility]:   Refer to the procedure report that was given to you for any specific questions about what was found during the examination.  If the procedure report does not answer your questions, please call your gastroenterologist to clarify.  If you requested that your care partner not be given the details of your procedure findings, then the procedure report has been included in a sealed envelope for you to review at your convenience later.  YOU SHOULD EXPECT: Some feelings of bloating in the abdomen. Passage of more gas than usual.  Walking can help get rid of the air that was put into your GI tract during the procedure and reduce the bloating. If you had a lower endoscopy (such as a colonoscopy or flexible sigmoidoscopy) you may notice spotting of blood in your stool or on the toilet paper. If you underwent a bowel prep for your procedure, you may not have a normal bowel movement for a few days.  Please Note:  You might notice some irritation and congestion in your nose or some drainage.  This is from the oxygen used during your procedure.  There is no need for concern and it should clear up in a day or so.  SYMPTOMS TO REPORT IMMEDIATELY:   Following lower endoscopy (colonoscopy or flexible sigmoidoscopy):  Excessive amounts of blood in the stool  Significant tenderness or worsening of abdominal pains  Swelling of the abdomen that is new, acute  Fever of 100F or higher  For urgent or emergent issues, a gastroenterologist can be reached at any hour by calling [phone].   DIET:  We do recommend a small meal at first, but then you may proceed to your regular diet.  Drink plenty of fluids but you should  avoid alcoholic beverages for 24 hours.  ACTIVITY:  You should plan to take it easy for the rest of today and you should NOT DRIVE or use heavy machinery until tomorrow (because of the sedation medicines used during the test).    FOLLOW UP: Our staff will call the number listed on your records the next business day following your procedure to check on you and address any questions or concerns that you may have regarding the information given to you following your procedure. If we do not reach you, we will leave a message.  However, if you are feeling well and you are not experiencing any problems, there is no need to return our call.  We will assume that you have returned to your regular daily activities without incident.  If any biopsies were taken you will be contacted by phone or by letter within the next 1-3 weeks.  Please call us at [phone] if you have not heard about the biopsies in 3 weeks.   Await for biopsy results to determine next repeat Colonoscopy screening Polyps (handout given)   SIGNATURES/CONFIDENTIALITY: You and/or your care partner have signed paperwork which will be entered into your electronic medical record.  These signatures attest to the fact that that the information above on your After Visit Summary has been reviewed and is understood.  Full responsibility of the confidentiality of this discharge information lies with you and/or your care-partner.

## 2016-08-12 NOTE — Progress Notes (Signed)
A and O x3. Report to RN. Tolerated MAC anesthesia well.

## 2016-08-12 NOTE — Op Note (Signed)
[facility] Patient Name: [name] First Procedure Date: 08/12/2016 11:54 AM MRN: [id number] Endoscopist: Gatha Mayer , MD Age: 56 Referring MD:  Date of Birth: October 19, 1960 Gender: Female Account #: [id number] Procedure:                Colonoscopy Indications:              Screening for colorectal malignant neoplasm, This                            is the patient's first colonoscopy Medicines:                Propofol per Anesthesia, Monitored Anesthesia Care Procedure:                Pre-Anesthesia Assessment:                           - Prior to the procedure, a History and Physical                            was performed, and patient medications and                            allergies were reviewed. The patient's tolerance of                            previous anesthesia was also reviewed. The risks                            and benefits of the procedure and the sedation                            options and risks were discussed with the patient.                            All questions were answered, and informed consent                            was obtained. Prior Anticoagulants: The patient has                            taken no previous anticoagulant or antiplatelet                            agents. ASA Grade Assessment: II - A patient with                            mild systemic disease. After reviewing the risks                            and benefits, the patient was deemed in                            satisfactory condition to undergo the procedure.  After obtaining informed consent, the colonoscope                            was passed under direct vision. Throughout the                            procedure, the patient's blood pressure, pulse, and                            oxygen saturations were monitored continuously. The                            Colonoscope was introduced through the anus and   advanced to the the cecum, identified by                            appendiceal orifice and ileocecal valve. The                            colonoscopy was performed without difficulty. The                            patient tolerated the procedure well. The quality                            of the bowel preparation was good. The bowel                            preparation used was Miralax. The ileocecal valve,                            appendiceal orifice, and rectum were photographed. Scope In: 11:57:29 AM Scope Out: 12:08:29 PM Scope Withdrawal Time: 0 hours 7 minutes 47 seconds  Total Procedure Duration: 0 hours 11 minutes 0 seconds  Findings:                 The perianal and digital rectal examinations were                            normal.                           A diminutive polyp was found in the ascending                            colon. The polyp was sessile. The polyp was removed                            with a cold biopsy forceps. Resection and retrieval                            were complete. Verification of patient                            identification for the specimen  was done. Estimated                            blood loss was minimal.                           The exam was otherwise without abnormality on                            direct and retroflexion views. Complications:            No immediate complications. Estimated Blood Loss:     Estimated blood loss was minimal. Impression:               - One diminutive polyp in the ascending colon,                            removed with a cold biopsy forceps. Resected and                            retrieved.                           - The examination was otherwise normal on direct                            and retroflexion views. Recommendation:           - Patient has a contact number available for                            emergencies. The signs and symptoms of potential                             delayed complications were discussed with the                            patient. Return to normal activities tomorrow.                            Written discharge instructions were provided to the                            patient.                           - Resume previous diet.                           - Continue present medications.                           - Repeat colonoscopy is recommended. The                            colonoscopy date will be determined after pathology  results from today's exam become available for                            review. Gatha Mayer, MD 08/12/2016 12:13:45 PM This report has been signed electronically.

## 2016-08-12 NOTE — Procedures (Signed)
Called to room to assist during endoscopic procedure.  Patient ID and intended procedure confirmed with present staff. Received instructions for my participation in the procedure from the performing physician.  

## 2016-08-13 ENCOUNTER — Telehealth: Payer: Self-pay | Admitting: *Deleted

## 2016-08-13 NOTE — Telephone Encounter (Signed)
  Follow up Call-  Call back number 08/12/2016  Post procedure Call Back phone  # [phone]  Permission to leave phone message Yes  Some recent data might be hidden     Patient questions:  Message left to call us if necessary.  Second call.

## 2016-08-13 NOTE — Telephone Encounter (Signed)
No answer, left message to call if questions or concerns. 

## 2016-08-18 ENCOUNTER — Encounter: Payer: Self-pay | Admitting: Internal Medicine

## 2016-08-18 NOTE — Progress Notes (Signed)
Lymphoid polyp recall 2028

## 2016-09-09 ENCOUNTER — Other Ambulatory Visit: Payer: BLUE CROSS/BLUE SHIELD

## 2016-09-19 ENCOUNTER — Ambulatory Visit
Admission: RE | Admit: 2016-09-19 | Discharge: 2016-09-19 | Disposition: A | Discharge status: Home / Self Care | Payer: BLUE CROSS/BLUE SHIELD | Source: Ambulatory Visit | Attending: Women's Health | Admitting: Women's Health

## 2016-09-19 DIAGNOSIS — Z1231 Encounter for screening mammogram for malignant neoplasm of breast: Secondary | ICD-10-CM

## 2016-09-19 DIAGNOSIS — Z780 Asymptomatic menopausal state: Secondary | ICD-10-CM

## 2016-09-19 DIAGNOSIS — Z13820 Encounter for screening for osteoporosis: Secondary | ICD-10-CM | POA: Diagnosis not present

## 2017-07-09 ENCOUNTER — Other Ambulatory Visit: Payer: Self-pay

## 2017-07-09 DIAGNOSIS — F419 Anxiety disorder, unspecified: Secondary | ICD-10-CM

## 2017-07-10 ENCOUNTER — Other Ambulatory Visit: Payer: Self-pay

## 2017-07-10 DIAGNOSIS — F419 Anxiety disorder, unspecified: Secondary | ICD-10-CM

## 2017-07-10 MED ORDER — VENLAFAXINE HCL 75 MG PO TABS: ORAL_TABLET | ORAL | 0 refills | Status: DC

## 2017-07-10 NOTE — Telephone Encounter (Signed)
Last CE 06/10/16.  Has appointment scheduled for CE 09/16/17.

## 2017-08-05 ENCOUNTER — Other Ambulatory Visit: Payer: Self-pay

## 2017-08-05 DIAGNOSIS — F419 Anxiety disorder, unspecified: Secondary | ICD-10-CM

## 2017-08-05 MED ORDER — VENLAFAXINE HCL 75 MG PO TABS: ORAL_TABLET | ORAL | 0 refills | Status: DC

## 2017-08-05 NOTE — Telephone Encounter (Signed)
Patient has her annual scheduled. I had refilled her generic Effexor back on 07/10/17 but evidently her mail order pharmacy said they did not receive it even though it shows confirmed receipt on our end. Patient needs refill to get her through to her August appt. Only has a weeks worth left. Rx refill sent.

## 2017-08-07 ENCOUNTER — Telehealth: Payer: Self-pay | Admitting: *Deleted

## 2017-08-07 NOTE — Telephone Encounter (Signed)
[facility] mail order pharmacy called regarding Effexor 75 mg tablet states this dose is typically prescribed 1 po twice daily and they just want to confirm the direction are to continue once daily? Please advise

## 2017-08-07 NOTE — Telephone Encounter (Signed)
Please call pt she is overdue for annual, I think she has only been taking 1 daily, if so continue one daily until OV

## 2017-08-07 NOTE — Telephone Encounter (Signed)
She is scheduled for annual on 09/16/17, correct she has been taking 1 daily

## 2017-08-07 NOTE — Telephone Encounter (Signed)
Ok to continue 1 daily, thanks

## 2017-08-11 ENCOUNTER — Telehealth: Payer: Self-pay | Admitting: *Deleted

## 2017-08-11 MED ORDER — VENLAFAXINE HCL 75 MG PO TABS: 75.0000 mg | ORAL_TABLET | Freq: Every day | ORAL | 0 refills | Status: DC

## 2017-08-11 NOTE — Telephone Encounter (Signed)
Patient called stating she needs refill on effexor 75 mg tablet, mail order has not mailed medication out yet. Rx sent, annual scheduled on 09/16/17. 30 day supply sent

## 2017-09-16 ENCOUNTER — Encounter: Payer: BLUE CROSS/BLUE SHIELD | Admitting: Women's Health

## 2017-09-24 ENCOUNTER — Other Ambulatory Visit: Payer: Self-pay | Admitting: Women's Health

## 2017-09-24 DIAGNOSIS — Z1231 Encounter for screening mammogram for malignant neoplasm of breast: Secondary | ICD-10-CM

## 2017-09-29 ENCOUNTER — Other Ambulatory Visit: Payer: Self-pay | Admitting: Women's Health

## 2017-09-29 DIAGNOSIS — F419 Anxiety disorder, unspecified: Secondary | ICD-10-CM

## 2017-09-30 NOTE — Telephone Encounter (Signed)
Annual on 10/23/17

## 2017-09-30 NOTE — Telephone Encounter (Signed)
Ok for refill? 

## 2017-10-20 ENCOUNTER — Ambulatory Visit (INDEPENDENT_AMBULATORY_CARE_PROVIDER_SITE_OTHER): Payer: BLUE CROSS/BLUE SHIELD | Admitting: Women's Health

## 2017-10-20 ENCOUNTER — Encounter: Payer: Self-pay | Admitting: Women's Health

## 2017-10-20 VITALS — BP 118/80 | Ht 65.0 in | Wt 223.0 lb

## 2017-10-20 DIAGNOSIS — Z01419 Encounter for gynecological examination (general) (routine) without abnormal findings: Secondary | ICD-10-CM | POA: Diagnosis not present

## 2017-10-20 DIAGNOSIS — E559 Vitamin D deficiency, unspecified: Secondary | ICD-10-CM | POA: Diagnosis not present

## 2017-10-20 DIAGNOSIS — Z23 Encounter for immunization: Secondary | ICD-10-CM | POA: Diagnosis not present

## 2017-10-20 MED ORDER — VENLAFAXINE HCL 75 MG PO TABS: 75.0000 mg | ORAL_TABLET | Freq: Every day | ORAL | 4 refills | Status: DC

## 2017-10-20 MED ORDER — VENLAFAXINE HCL 75 MG PO TABS: 75.0000 mg | ORAL_TABLET | Freq: Two times a day (BID) | ORAL | 0 refills | Status: DC

## 2017-10-20 MED ORDER — VENLAFAXINE HCL 75 MG PO TABS: 75.0000 mg | ORAL_TABLET | Freq: Two times a day (BID) | ORAL | 4 refills | Status: DC

## 2017-10-20 NOTE — Patient Instructions (Addendum)
Health Maintenance for Postmenopausal Women Menopause is a normal process in which your reproductive ability comes to an end. This process happens gradually over a span of months to years, usually between the ages of 22 and 9. Menopause is complete when you have missed 12 consecutive menstrual periods. It is important to talk with your health care provider about some of the most common conditions that affect postmenopausal women, such as heart disease, cancer, and bone loss (osteoporosis). Adopting a healthy lifestyle and getting preventive care can help to promote your health and wellness. Those actions can also lower your chances of developing some of these common conditions. What should I know about menopause? During menopause, you may experience a number of symptoms, such as:  Moderate-to-severe hot flashes.  Night sweats.  Decrease in sex drive.  Mood swings.  Headaches.  Tiredness.  Irritability.  Memory problems.  Insomnia.  Choosing to treat or not to treat menopausal changes is an individual decision that you make with your health care provider. What should I know about hormone replacement therapy and supplements? Hormone therapy products are effective for treating symptoms that are associated with menopause, such as hot flashes and night sweats. Hormone replacement carries certain risks, especially as you become older. If you are thinking about using estrogen or estrogen with progestin treatments, discuss the benefits and risks with your health care provider. What should I know about heart disease and stroke? Heart disease, heart attack, and stroke become more likely as you age. This may be due, in part, to the hormonal changes that your body experiences during menopause. These can affect how your body processes dietary fats, triglycerides, and cholesterol. Heart attack and stroke are both medical emergencies. There are many things that you can do to help prevent heart disease  and stroke:  Have your blood pressure checked at least every 1-2 years. High blood pressure causes heart disease and increases the risk of stroke.  If you are 53-22 years old, ask your health care provider if you should take aspirin to prevent a heart attack or a stroke.  Do not use any tobacco products, including cigarettes, chewing tobacco, or electronic cigarettes. If you need help quitting, ask your health care provider.  It is important to eat a healthy diet and maintain a healthy weight. ? Be sure to include plenty of vegetables, fruits, low-fat dairy products, and lean protein. ? Avoid eating foods that are high in solid fats, added sugars, or salt (sodium).  Get regular exercise. This is one of the most important things that you can do for your health. ? Try to exercise for at least 150 minutes each week. The type of exercise that you do should increase your heart rate and make you sweat. This is known as moderate-intensity exercise. ? Try to do strengthening exercises at least twice each week. Do these in addition to the moderate-intensity exercise.  Know your numbers.Ask your health care provider to check your cholesterol and your blood glucose. Continue to have your blood tested as directed by your health care provider.  What should I know about cancer screening? There are several types of cancer. Take the following steps to reduce your risk and to catch any cancer development as early as possible. Breast Cancer  Practice breast self-awareness. ? This means understanding how your breasts normally appear and feel. ? It also means doing regular breast self-exams. Let your health care provider know about any changes, no matter how small.  If you are 40  or older, have a clinician do a breast exam (clinical breast exam or CBE) every year. Depending on your age, family history, and medical history, it may be recommended that you also have a yearly breast X-ray (mammogram).  If you  have a family history of breast cancer, talk with your health care provider about genetic screening.  If you are at high risk for breast cancer, talk with your health care provider about having an MRI and a mammogram every year.  Breast cancer (BRCA) gene test is recommended for women who have family members with BRCA-related cancers. Results of the assessment will determine the need for genetic counseling and BRCA1 and for BRCA2 testing. BRCA-related cancers include these types: ? Breast. This occurs in males or females. ? Ovarian. ? Tubal. This may also be called fallopian tube cancer. ? Cancer of the abdominal or pelvic lining (peritoneal cancer). ? Prostate. ? Pancreatic.  Cervical, Uterine, and Ovarian Cancer Your health care provider may recommend that you be screened regularly for cancer of the pelvic organs. These include your ovaries, uterus, and vagina. This screening involves a pelvic exam, which includes checking for microscopic changes to the surface of your cervix (Pap test).  For women ages 21-65, health care providers may recommend a pelvic exam and a Pap test every three years. For women ages 79-65, they may recommend the Pap test and pelvic exam, combined with testing for human papilloma virus (HPV), every five years. Some types of HPV increase your risk of cervical cancer. Testing for HPV may also be done on women of any age who have unclear Pap test results.  Other health care providers may not recommend any screening for nonpregnant women who are considered low risk for pelvic cancer and have no symptoms. Ask your health care provider if a screening pelvic exam is right for you.  If you have had past treatment for cervical cancer or a condition that could lead to cancer, you need Pap tests and screening for cancer for at least 20 years after your treatment. If Pap tests have been discontinued for you, your risk factors (such as having a new sexual partner) need to be  reassessed to determine if you should start having screenings again. Some women have medical problems that increase the chance of getting cervical cancer. In these cases, your health care provider may recommend that you have screening and Pap tests more often.  If you have a family history of uterine cancer or ovarian cancer, talk with your health care provider about genetic screening.  If you have vaginal bleeding after reaching menopause, tell your health care provider.  There are currently no reliable tests available to screen for ovarian cancer.  Lung Cancer Lung cancer screening is recommended for adults 69-62 years old who are at high risk for lung cancer because of a history of smoking. A yearly low-dose CT scan of the lungs is recommended if you:  Currently smoke.  Have a history of at least 30 pack-years of smoking and you currently smoke or have quit within the past 15 years. A pack-year is smoking an average of one pack of cigarettes per day for one year.  Yearly screening should:  Continue until it has been 15 years since you quit.  Stop if you develop a health problem that would prevent you from having lung cancer treatment.  Colorectal Cancer  This type of cancer can be detected and can often be prevented.  Routine colorectal cancer screening usually begins at  age 42 and continues through age 45.  If you have risk factors for colon cancer, your health care provider may recommend that you be screened at an earlier age.  If you have a family history of colorectal cancer, talk with your health care provider about genetic screening.  Your health care provider may also recommend using home test kits to check for hidden blood in your stool.  A small camera at the end of a tube can be used to examine your colon directly (sigmoidoscopy or colonoscopy). This is done to check for the earliest forms of colorectal cancer.  Direct examination of the colon should be repeated every  5-10 years until age 71. However, if early forms of precancerous polyps or small growths are found or if you have a family history or genetic risk for colorectal cancer, you may need to be screened more often.  Skin Cancer  Check your skin from head to toe regularly.  Monitor any moles. Be sure to tell your health care provider: ? About any new moles or changes in moles, especially if there is a change in a mole's shape or color. ? If you have a mole that is larger than the size of a pencil eraser.  If any of your family members has a history of skin cancer, especially at a young age, talk with your health care provider about genetic screening.  Always use sunscreen. Apply sunscreen liberally and repeatedly throughout the day.  Whenever you are outside, protect yourself by wearing long sleeves, pants, a wide-brimmed hat, and sunglasses.  What should I know about osteoporosis? Osteoporosis is a condition in which bone destruction happens more quickly than new bone creation. After menopause, you may be at an increased risk for osteoporosis. To help prevent osteoporosis or the bone fractures that can happen because of osteoporosis, the following is recommended:  If you are 46-71 years old, get at least 1,000 mg of calcium and at least 600 mg of vitamin D per day.  If you are older than age 55 but younger than age 65, get at least 1,200 mg of calcium and at least 600 mg of vitamin D per day.  If you are older than age 54, get at least 1,200 mg of calcium and at least 800 mg of vitamin D per day.  Smoking and excessive alcohol intake increase the risk of osteoporosis. Eat foods that are rich in calcium and vitamin D, and do weight-bearing exercises several times each week as directed by your health care provider. What should I know about how menopause affects my mental health? Depression may occur at any age, but it is more common as you become older. Common symptoms of depression  include:  Low or sad mood.  Changes in sleep patterns.  Changes in appetite or eating patterns.  Feeling an overall lack of motivation or enjoyment of activities that you previously enjoyed.  Frequent crying spells.  Talk with your health care provider if you think that you are experiencing depression. What should I know about immunizations? It is important that you get and maintain your immunizations. These include:  Tetanus, diphtheria, and pertussis (Tdap) booster vaccine.  Influenza every year before the flu season begins.  Pneumonia vaccine.  Shingles vaccine.  Your health care provider may also recommend other immunizations. This information is not intended to replace advice given to you by your health care provider. Make sure you discuss any questions you have with your health care provider. Document Released: 03/08/2005  Document Revised: 08/04/2015 Document Reviewed: 10/18/2014 Elsevier Interactive Patient Education  2018 [employer]. Urinary Incontinence Urinary incontinence is the involuntary loss of urine from your bladder. What are the causes? There are many causes of urinary incontinence. They include:  Medicines.  Infections.  Prostatic enlargement, leading to overflow of urine from your bladder.  Surgery.  Neurological diseases.  Emotional factors.  What are the signs or symptoms? Urinary Incontinence can be divided into four types: 1. Urge incontinence. Urge incontinence is the involuntary loss of urine before you have the opportunity to go to the bathroom. There is a sudden urge to void but not enough time to reach a bathroom. 2. Stress incontinence. Stress incontinence is the sudden loss of urine with any activity that forces urine to pass. It is commonly caused by anatomical changes to the pelvis and sphincter areas of your body. 3. Overflow incontinence. Overflow incontinence is the loss of urine from an obstructed opening to your bladder. This  results in a backup of urine and a resultant buildup of pressure within the bladder. When the pressure within the bladder exceeds the closing pressure of the sphincter, the urine overflows, which causes incontinence, similar to water overflowing a dam. 4. Total incontinence. Total incontinence is the loss of urine as a result of the inability to store urine within your bladder.  How is this diagnosed? Evaluating the cause of incontinence may require:  A thorough and complete medical and obstetric history.  A complete physical exam.  Laboratory tests such as a urine culture and sensitivities.  When additional tests are indicated, they can include:  An ultrasound exam.  Kidney and bladder X-rays.  Cystoscopy. This is an exam of the bladder using a narrow scope.  Urodynamic testing to test the nerve function to the bladder and sphincter areas.  How is this treated? Treatment for urinary incontinence depends on the cause:  For urge incontinence caused by a bacterial infection, antibiotics will be prescribed. If the urge incontinence is related to medicines you take, your health care provider may have you change the medicine.  For stress incontinence, surgery to re-establish anatomical support to the bladder or sphincter, or both, will often correct the condition.  For overflow incontinence caused by an enlarged prostate, an operation to open the channel through the enlarged prostate will allow the flow of urine out of the bladder. In women with fibroids, a hysterectomy may be recommended.  For total incontinence, surgery on your urinary sphincter may help. An artificial urinary sphincter (an inflatable cuff placed around the urethra) may be required. In women who have developed a hole-like passage between their bladder and vagina (vesicovaginal fistula), surgery to close the fistula often is required.  Follow these instructions at home:  Normal daily hygiene and the use of pads or  adult diapers that are changed regularly will help prevent odors and skin damage.  Avoid caffeine. It can overstimulate your bladder.  Use the bathroom regularly. Try about every 2-3 hours to go to the bathroom, even if you do not feel the need to do so. Take time to empty your bladder completely. After urinating, wait a minute. Then try to urinate again.  For causes involving nerve dysfunction, keep a log of the medicines you take and a journal of the times you go to the bathroom. Contact a health care provider if:  You experience worsening of pain instead of improvement in pain after your procedure.  Your incontinence becomes worse instead of better. Get help  right away if:  You experience fever or shaking chills.  You are unable to pass your urine.  You have redness spreading into your groin or down into your thighs. This information is not intended to replace advice given to you by your health care provider. Make sure you discuss any questions you have with your health care provider. Document Released: 02/22/2004 Document Revised: 08/25/2015 Document Reviewed: 06/23/2012 Elsevier Interactive Patient Education  2018 [employer].  Carbohydrate Counting for Diabetes Mellitus, Adult Carbohydrate counting is a method for keeping track of how many carbohydrates you eat. Eating carbohydrates naturally increases the amount of sugar (glucose) in the blood. Counting how many carbohydrates you eat helps keep your blood glucose within normal limits, which helps you manage your diabetes (diabetes mellitus). It is important to know how many carbohydrates you can safely have in each meal. This is different for every person. A diet and nutrition specialist (registered dietitian) can help you make a meal plan and calculate how many carbohydrates you should have at each meal and snack. Carbohydrates are found in the following foods: Grains, such as breads and cereals. Dried beans and soy  products. Starchy vegetables, such as potatoes, peas, and corn. Fruit and fruit juices. Milk and yogurt. Sweets and snack foods, such as cake, cookies, candy, chips, and soft drinks.  How do I count carbohydrates? There are two ways to count carbohydrates in food. You can use either of the methods or a combination of both. Reading "Nutrition Facts" on packaged food The "Nutrition Facts" list is included on the labels of almost all packaged foods and beverages in the [place] It includes: The serving size. Information about nutrients in each serving, including the grams (g) of carbohydrate per serving.  To use the "Nutrition Facts": Decide how many servings you will have. Multiply the number of servings by the number of carbohydrates per serving. The resulting number is the total amount of carbohydrates that you will be having.  Learning standard serving sizes of other foods When you eat foods containing carbohydrates that are not packaged or do not include "Nutrition Facts" on the label, you need to measure the servings in order to count the amount of carbohydrates: Measure the foods that you will eat with a food scale or measuring cup, if needed. Decide how many standard-size servings you will eat. Multiply the number of servings by 15. Most carbohydrate-rich foods have about 15 g of carbohydrates per serving. For example, if you eat 8 oz (170 g) of strawberries, you will have eaten 2 servings and 30 g of carbohydrates (2 servings x 15 g = 30 g). For foods that have more than one food mixed, such as soups and casseroles, you must count the carbohydrates in each food that is included.  The following list contains standard serving sizes of common carbohydrate-rich foods. Each of these servings has about 15 g of carbohydrates:  hamburger bun or  English muffin.  oz (15 mL) syrup.  oz (14 g) jelly. 1 slice of bread. 1 six-inch tortilla. 3 oz (85 g) cooked rice or pasta. 4 oz (113 g)  cooked dried beans. 4 oz (113 g) starchy vegetable, such as peas, corn, or potatoes. 4 oz (113 g) hot cereal. 4 oz (113 g) mashed potatoes or  of a large baked potato. 4 oz (113 g) canned or frozen fruit. 4 oz (120 mL) fruit juice. 4-6 crackers. 6 chicken nuggets. 6 oz (170 g) unsweetened dry cereal. 6 oz (170 g) plain fat-free  yogurt or yogurt sweetened with artificial sweeteners. 8 oz (240 mL) milk. 8 oz (170 g) fresh fruit or one small piece of fruit. 24 oz (680 g) popped popcorn.  Example of carbohydrate counting Sample meal 3 oz (85 g) chicken breast. 6 oz (170 g) brown rice. 4 oz (113 g) corn. 8 oz (240 mL) milk. 8 oz (170 g) strawberries with sugar-free whipped topping. Carbohydrate calculation Identify the foods that contain carbohydrates: Rice. Corn. Milk. Strawberries. Calculate how many servings you have of each food: 2 servings rice. 1 serving corn. 1 serving milk. 1 serving strawberries. Multiply each number of servings by 15 g: 2 servings rice x 15 g = 30 g. 1 serving corn x 15 g = 15 g. 1 serving milk x 15 g = 15 g. 1 serving strawberries x 15 g = 15 g. Add together all of the amounts to find the total grams of carbohydrates eaten: 30 g + 15 g + 15 g + 15 g = 75 g of carbohydrates total. This information is not intended to replace advice given to you by your health care provider. Make sure you discuss any questions you have with your health care provider. Document Released: 01/14/2005 Document Revised: 08/04/2015 Document Reviewed: 06/28/2015 Elsevier Interactive Patient Education  [employer].

## 2017-10-20 NOTE — Progress Notes (Signed)
[name] 05/29/1960 [id number]    History:    Presents for annual exam.  2005 TAH for fibroids.  2008 LAP-BAND surgery had 100 pound weight loss but has regained 50 pounds.  Normal Pap and mammogram history.  2018- colon polyp.  2018 T score -1.1 FRAX 5.5% / 0.3%.  Tdap 2018.  Past medical history, past surgical history, family history and social history were all reviewed and documented in the EPIC chart.  Freight forwarder at [employer].  2 daughters and 2 granddaughters.  ROS:  A ROS was performed and pertinent positives and negatives are included.  Exam:  Vitals:   10/20/17 1435  BP: 118/80  Weight: 223 lb (101.2 kg)  Height: 5\' 5"  (1.651 m)   Body mass index is 37.11 kg/m.   General appearance:  Normal Thyroid:  Symmetrical, normal in size, without palpable masses or nodularity. Respiratory  Auscultation:  Clear without wheezing or rhonchi Cardiovascular  Auscultation:  Regular rate, without rubs, murmurs or gallops  Edema/varicosities:  Not grossly evident Abdominal  Soft,nontender, without masses, guarding or rebound.  Liver/spleen:  No organomegaly noted  Hernia:  None appreciated  Skin  Inspection:  Grossly normal   Breasts: Examined lying and sitting.     Right: Without masses, retractions, discharge or axillary adenopathy.     Left: Without masses, retractions, discharge or axillary adenopathy. Gentitourinary   Inguinal/mons:  Normal without inguinal adenopathy  External genitalia:  Normal  BUS/Urethra/Skene's glands:  Normal  Vagina:  Normal  Cervix: And uterus absent  Adnexa/parametria:     Rt: Without masses or tenderness.   Lt: Without masses or tenderness.  Anus and perineum: Normal  Digital rectal exam: Normal sphincter tone without palpated masses or tenderness  Assessment/Plan:  57 y.o. MWF G2, P2 for annual exam with complaint of stress incontinence mostly occurring only at work, none at home.  2005 TAH for fibroids no HRT Osteopenia without elevated  FRAX Obesity Anxiety/depression stable on Effexor  Plan: Reviewed bladder irritants, need to empty bladder on regular basis at work, losing abdominal weight will also help.  SBE's, annual screening mammogram, calcium rich foods, vitamin D 2000 daily encouraged.  Effexor 75 mg daily, prescription, proper use given and reviewed.  CBC, CMP, vitamin D, pap screening guidelines reviewed.  Lipid panel excellent last year will recheck next year fasting.  Reviewed importance of increasing exercise and decreasing calorie/carbs for weight loss.     Huel Cote [facility], 2:57 PM 10/20/2017

## 2017-10-21 LAB — CBC WITH DIFFERENTIAL/PLATELET
BASOS ABS: 53 {cells}/uL (ref 0–200)
Basophils Relative: 0.4 %
EOS PCT: 0.8 %
Eosinophils Absolute: 106 cells/uL (ref 15–500)
HCT: 43.3 % (ref 35.0–45.0)
HEMOGLOBIN: 14.3 g/dL (ref 11.7–15.5)
Lymphs Abs: 1492 cells/uL (ref 850–3900)
MCH: 27.6 pg (ref 27.0–33.0)
MCHC: 33 g/dL (ref 32.0–36.0)
MCV: 83.4 fL (ref 80.0–100.0)
MONOS PCT: 5.2 %
MPV: 10.2 fL (ref 7.5–12.5)
NEUTROS ABS: 10864 {cells}/uL — AB (ref 1500–7800)
Neutrophils Relative %: 82.3 %
PLATELETS: 209 10*3/uL (ref 140–400)
RBC: 5.19 10*6/uL — ABNORMAL HIGH (ref 3.80–5.10)
RDW: 13.3 % (ref 11.0–15.0)
TOTAL LYMPHOCYTE: 11.3 %
WBC mixed population: 686 cells/uL (ref 200–950)
WBC: 13.2 10*3/uL — ABNORMAL HIGH (ref 3.8–10.8)

## 2017-10-21 LAB — COMPREHENSIVE METABOLIC PANEL
AG RATIO: 1.8 (calc) (ref 1.0–2.5)
ALBUMIN MSPROF: 4.2 g/dL (ref 3.6–5.1)
ALT: 12 U/L (ref 6–29)
AST: 15 U/L (ref 10–35)
Alkaline phosphatase (APISO): 59 U/L (ref 33–130)
BUN: 12 mg/dL (ref 7–25)
CO2: 26 mmol/L (ref 20–32)
CREATININE: 0.78 mg/dL (ref 0.50–1.05)
Calcium: 9.1 mg/dL (ref 8.6–10.4)
Chloride: 104 mmol/L (ref 98–110)
GLOBULIN: 2.3 g/dL (ref 1.9–3.7)
Glucose, Bld: 84 mg/dL (ref 65–99)
Potassium: 4.2 mmol/L (ref 3.5–5.3)
Sodium: 139 mmol/L (ref 135–146)
Total Bilirubin: 0.7 mg/dL (ref 0.2–1.2)
Total Protein: 6.5 g/dL (ref 6.1–8.1)

## 2017-10-21 LAB — VITAMIN D 25 HYDROXY (VIT D DEFICIENCY, FRACTURES): Vit D, 25-Hydroxy: 26 ng/mL — ABNORMAL LOW (ref 30–100)

## 2017-10-23 ENCOUNTER — Ambulatory Visit: Payer: BLUE CROSS/BLUE SHIELD

## 2018-11-02 ENCOUNTER — Other Ambulatory Visit: Payer: Self-pay | Admitting: Women's Health

## 2019-03-22 ENCOUNTER — Other Ambulatory Visit: Payer: Self-pay | Admitting: Women's Health

## 2019-03-22 DIAGNOSIS — Z1231 Encounter for screening mammogram for malignant neoplasm of breast: Secondary | ICD-10-CM

## 2019-03-23 ENCOUNTER — Ambulatory Visit: Payer: BLUE CROSS/BLUE SHIELD

## 2019-03-23 ENCOUNTER — Other Ambulatory Visit: Payer: Self-pay

## 2019-03-24 ENCOUNTER — Ambulatory Visit (INDEPENDENT_AMBULATORY_CARE_PROVIDER_SITE_OTHER): Payer: BC Managed Care – PPO | Admitting: Women's Health

## 2019-03-24 ENCOUNTER — Encounter: Payer: Self-pay | Admitting: Women's Health

## 2019-03-24 ENCOUNTER — Encounter: Payer: BLUE CROSS/BLUE SHIELD | Admitting: Women's Health

## 2019-03-24 ENCOUNTER — Ambulatory Visit
Admission: RE | Admit: 2019-03-24 | Discharge: 2019-03-24 | Disposition: A | Discharge status: Home / Self Care | Payer: BLUE CROSS/BLUE SHIELD | Source: Ambulatory Visit | Attending: Women's Health | Admitting: Women's Health

## 2019-03-24 VITALS — BP 126/80 | Ht 65.0 in | Wt 236.0 lb

## 2019-03-24 DIAGNOSIS — Z1231 Encounter for screening mammogram for malignant neoplasm of breast: Secondary | ICD-10-CM | POA: Diagnosis not present

## 2019-03-24 DIAGNOSIS — Z01419 Encounter for gynecological examination (general) (routine) without abnormal findings: Secondary | ICD-10-CM | POA: Diagnosis not present

## 2019-03-24 DIAGNOSIS — Z13820 Encounter for screening for osteoporosis: Secondary | ICD-10-CM

## 2019-03-24 DIAGNOSIS — Z13220 Encounter for screening for lipoid disorders: Secondary | ICD-10-CM | POA: Diagnosis not present

## 2019-03-24 LAB — CBC WITH DIFFERENTIAL/PLATELET
Absolute Monocytes: 683 cells/uL (ref 200–950)
Basophils Absolute: 54 cells/uL (ref 0–200)
Basophils Relative: 0.4 %
Eosinophils Absolute: 134 cells/uL (ref 15–500)
Eosinophils Relative: 1 %
HCT: 43.8 % (ref 35.0–45.0)
Hemoglobin: 14 g/dL (ref 11.7–15.5)
Lymphs Abs: 1809 cells/uL (ref 850–3900)
MCH: 27 pg (ref 27.0–33.0)
MCHC: 32 g/dL (ref 32.0–36.0)
MCV: 84.4 fL (ref 80.0–100.0)
MPV: 10.4 fL (ref 7.5–12.5)
Monocytes Relative: 5.1 %
Neutro Abs: 10720 cells/uL — ABNORMAL HIGH (ref 1500–7800)
Neutrophils Relative %: 80 %
Platelets: 312 10*3/uL (ref 140–400)
RBC: 5.19 10*6/uL — ABNORMAL HIGH (ref 3.80–5.10)
RDW: 13.5 % (ref 11.0–15.0)
Total Lymphocyte: 13.5 %
WBC: 13.4 10*3/uL — ABNORMAL HIGH (ref 3.8–10.8)

## 2019-03-24 LAB — LIPID PANEL
Cholesterol: 193 mg/dL (ref <200)
HDL: 59 mg/dL (ref >=50)
LDL Cholesterol (Calc): 116 mg/dL (calc) — ABNORMAL HIGH
Non-HDL Cholesterol (Calc): 134 mg/dL (calc) — ABNORMAL HIGH (ref <130)
Total CHOL/HDL Ratio: 3.3 (calc) (ref <5.0)
Triglycerides: 83 mg/dL (ref <150)

## 2019-03-24 LAB — COMPREHENSIVE METABOLIC PANEL
AG Ratio: 1.4 (calc) (ref 1.0–2.5)
ALT: 16 U/L (ref 6–29)
AST: 19 U/L (ref 10–35)
Albumin: 4.3 g/dL (ref 3.6–5.1)
Alkaline phosphatase (APISO): 75 U/L (ref 37–153)
BUN: 12 mg/dL (ref 7–25)
CO2: 26 mmol/L (ref 20–32)
Calcium: 9.9 mg/dL (ref 8.6–10.4)
Chloride: 103 mmol/L (ref 98–110)
Creat: 0.94 mg/dL (ref 0.50–1.05)
Globulin: 3.1 g/dL (calc) (ref 1.9–3.7)
Glucose, Bld: 90 mg/dL (ref 65–99)
Potassium: 4.7 mmol/L (ref 3.5–5.3)
Sodium: 140 mmol/L (ref 135–146)
Total Bilirubin: 0.8 mg/dL (ref 0.2–1.2)
Total Protein: 7.4 g/dL (ref 6.1–8.1)

## 2019-03-24 MED ORDER — VENLAFAXINE HCL 75 MG PO TABS: 75.0000 mg | ORAL_TABLET | Freq: Two times a day (BID) | ORAL | 4 refills | Status: DC

## 2019-03-24 NOTE — Patient Instructions (Signed)
Good to see you Vit D 2000 iu daily Health Maintenance, Female Adopting a healthy lifestyle and getting preventive care are important in promoting health and wellness. Ask your health care provider about:  The right schedule for you to have regular tests and exams.  Things you can do on your own to prevent diseases and keep yourself healthy. What should I know about diet, weight, and exercise? Eat a healthy diet   Eat a diet that includes plenty of vegetables, fruits, low-fat dairy products, and lean protein.  Do not eat a lot of foods that are high in solid fats, added sugars, or sodium. Maintain a healthy weight Body mass index (BMI) is used to identify weight problems. It estimates body fat based on height and weight. Your health care provider can help determine your BMI and help you achieve or maintain a healthy weight. Get regular exercise Get regular exercise. This is one of the most important things you can do for your health. Most adults should:  Exercise for at least 150 minutes each week. The exercise should increase your heart rate and make you sweat (moderate-intensity exercise).  Do strengthening exercises at least twice a week. This is in addition to the moderate-intensity exercise.  Spend less time sitting. Even light physical activity can be beneficial. Watch cholesterol and blood lipids Have your blood tested for lipids and cholesterol at 58 years of age, then have this test every 5 years. Have your cholesterol levels checked more often if:  Your lipid or cholesterol levels are high.  You are older than 58 years of age.  You are at high risk for heart disease. What should I know about cancer screening? Depending on your health history and family history, you may need to have cancer screening at various ages. This may include screening for:  Breast cancer.  Cervical cancer.  Colorectal cancer.  Skin cancer.  Lung cancer. What should I know about heart  disease, diabetes, and high blood pressure? Blood pressure and heart disease  High blood pressure causes heart disease and increases the risk of stroke. This is more likely to develop in people who have high blood pressure readings, are of African descent, or are overweight.  Have your blood pressure checked: ? Every 3-5 years if you are 4-80 years of age. ? Every year if you are 58 years old or older. Diabetes Have regular diabetes screenings. This checks your fasting blood sugar level. Have the screening done:  Once every three years after age 46 if you are at a normal weight and have a low risk for diabetes.  More often and at a younger age if you are overweight or have a high risk for diabetes. What should I know about preventing infection? Hepatitis B If you have a higher risk for hepatitis B, you should be screened for this virus. Talk with your health care provider to find out if you are at risk for hepatitis B infection. Hepatitis C Testing is recommended for:  Everyone born from [birth year] through 1964.  Anyone with known risk factors for hepatitis C. Sexually transmitted infections (STIs)  Get screened for STIs, including gonorrhea and chlamydia, if: ? You are sexually active and are younger than 58 years of age. ? You are older than 58 years of age and your health care provider tells you that you are at risk for this type of infection. ? Your sexual activity has changed since you were last screened, and you are at increased risk for  chlamydia or gonorrhea. Ask your health care provider if you are at risk.  Ask your health care provider about whether you are at high risk for HIV. Your health care provider may recommend a prescription medicine to help prevent HIV infection. If you choose to take medicine to prevent HIV, you should first get tested for HIV. You should then be tested every 3 months for as long as you are taking the medicine. Pregnancy  If you are about to stop  having your period (premenopausal) and you may become pregnant, seek counseling before you get pregnant.  Take 400 to 800 micrograms (mcg) of folic acid every day if you become pregnant.  Ask for birth control (contraception) if you want to prevent pregnancy. Osteoporosis and menopause Osteoporosis is a disease in which the bones lose minerals and strength with aging. This can result in bone fractures. If you are 58 years old or older, or if you are at risk for osteoporosis and fractures, ask your health care provider if you should:  Be screened for bone loss.  Take a calcium or vitamin D supplement to lower your risk of fractures.  Be given hormone replacement therapy (HRT) to treat symptoms of menopause. Follow these instructions at home: Lifestyle  Do not use any products that contain nicotine or tobacco, such as cigarettes, e-cigarettes, and chewing tobacco. If you need help quitting, ask your health care provider.  Do not use street drugs.  Do not share needles.  Ask your health care provider for help if you need support or information about quitting drugs. Alcohol use  Do not drink alcohol if: ? Your health care provider tells you not to drink. ? You are pregnant, may be pregnant, or are planning to become pregnant.  If you drink alcohol: ? Limit how much you use to 0-1 drink a day. ? Limit intake if you are breastfeeding.  Be aware of how much alcohol is in your drink. In the [place] one drink equals one 12 oz bottle of beer (355 mL), one 5 oz glass of wine (148 mL), or one 1 oz glass of hard liquor (44 mL). General instructions  Schedule regular health, dental, and eye exams.  Stay current with your vaccines.  Tell your health care provider if: ? You often feel depressed. ? You have ever been abused or do not feel safe at home. Summary  Adopting a healthy lifestyle and getting preventive care are important in promoting health and wellness.  Follow your health  care provider's instructions about healthy diet, exercising, and getting tested or screened for diseases.  Follow your health care provider's instructions on monitoring your cholesterol and blood pressure. This information is not intended to replace advice given to you by your health care provider. Make sure you discuss any questions you have with your health care provider. Document Revised: 01/07/2018 Document Reviewed: 01/07/2018 Elsevier Patient Education  2020 [employer].

## 2019-03-24 NOTE — Progress Notes (Signed)
[name] 1960-07-07 [id number]    History:    Presents for annual exam.  2005 TAH for fibroids.  2008 LAP-BAND surgery had 100 pound weight loss but has regained 60 pounds.  Normal Pap and mammogram history.  2018- colon polyp, 10-year follow-up.  2018 T score -1.1 FRAX 5.5% / 0.3%.  Tdap 2018.    Past medical history, past surgical history, family history and social history were all reviewed and documented in the EPIC chart. Retired from [employer] and is watching her two grandchildren during the week.   ROS:  A ROS was performed and pertinent positives and negatives are included.  Exam:  Vitals:   03/24/19 1136  BP: 126/80  Weight: 236 lb (107 kg)  Height: 5\' 5"  (1.651 m)   Body mass index is 39.27 kg/m.   General appearance:  Normal Thyroid:  Symmetrical, normal in size, without palpable masses or nodularity. Respiratory  Auscultation:  Clear without wheezing or rhonchi Cardiovascular  Auscultation:  Regular rate, without rubs, murmurs or gallops  Edema/varicosities:  Not grossly evident Abdominal  Soft,nontender, without masses, guarding or rebound.  Liver/spleen:  No organomegaly noted  Hernia:  None appreciated  Skin  Inspection:  Grossly normal   Breasts: Examined lying and sitting.     Right: Without masses, retractions, discharge or axillary adenopathy.     Left: Without masses, retractions, discharge or axillary adenopathy. Gentitourinary   Inguinal/mons:  Normal without inguinal adenopathy  External genitalia:  Normal  BUS/Urethra/Skene's glands:  Normal  Vagina:  Normal  Cervix:  And uterus absent  Adnexa/parametria:     Rt: Without masses or tenderness.   Lt: Without masses or tenderness.  Anus and perineum: Normal   Assessment/Plan:  59 y.o.  MWF G0P0 for annual exam with no complaints.    2005 TAH for fibroids no HRT Osteopenia without elevated FRAX- will schedule updated DEXA  Obesity/2008 LAP-BAND has regained some of the weight originally  lost Anxiety/depression stable on Effexor  Plan: Effexor 75 mg twice daily refill prescription given, reviewed importance of self-care, leisure activities.  Will schedule in office DEXA scan. CBC, CMP, lipid panel, and pap screening guidelines reviewed. SBE's, annual screening mammogram, calcium rich foods, vitamin D 2000 daily encouraged. Reviewed importance of increasing exercise and decreasing calorie/carbs for weight loss. Encouraged to make time for self care and rest to prevent exhaustion from watching grandchildren.   [place], 12:31 PM 03/24/2019

## 2019-05-21 ENCOUNTER — Ambulatory Visit: Payer: BC Managed Care – PPO | Attending: Internal Medicine

## 2019-05-21 DIAGNOSIS — Z23 Encounter for immunization: Secondary | ICD-10-CM

## 2019-05-21 NOTE — Progress Notes (Signed)
   Covid-19 Vaccination Clinic  Name:  [name]    MRN: [id number] DOB: 1960/11/15  05/21/2019  [name] was observed post Covid-19 immunization for 15 minutes without incident. She was provided with Vaccine Information Sheet and instruction to access the V-Safe system.   [name] was instructed to call 911 with any severe reactions post vaccine: Marland Kitchen Difficulty breathing  . Swelling of face and throat  . A fast heartbeat  . A bad rash all over body  . Dizziness and weakness   Immunizations Administered    Name Date Dose VIS Date Route   Pfizer COVID-19 Vaccine 05/21/2019 12:29 PM 0.3 mL 03/24/2018 Intramuscular   Manufacturer: [place]   Lot: [id number]   [place]: [id number]

## 2019-06-14 ENCOUNTER — Ambulatory Visit: Payer: BC Managed Care – PPO | Attending: Internal Medicine

## 2019-06-14 DIAGNOSIS — Z23 Encounter for immunization: Secondary | ICD-10-CM

## 2019-06-14 NOTE — Progress Notes (Signed)
   Covid-19 Vaccination Clinic  Name:  [name]    MRN: [id number] DOB: 1960/09/20  06/14/2019  [name] was observed post Covid-19 immunization for 15 minutes without incident. She was provided with Vaccine Information Sheet and instruction to access the V-Safe system.   [name] was instructed to call 911 with any severe reactions post vaccine: Marland Kitchen Difficulty breathing  . Swelling of face and throat  . A fast heartbeat  . A bad rash all over body  . Dizziness and weakness   Immunizations Administered    Name Date Dose VIS Date Route   Pfizer COVID-19 Vaccine 06/14/2019 12:01 PM 0.3 mL 03/24/2018 Intramuscular   Manufacturer: [place]   Lot: [id number]   [place]: [id number]

## 2020-02-24 DIAGNOSIS — Z20822 Contact with and (suspected) exposure to covid-19: Secondary | ICD-10-CM | POA: Diagnosis not present

## 2020-06-16 ENCOUNTER — Ambulatory Visit (INDEPENDENT_AMBULATORY_CARE_PROVIDER_SITE_OTHER): Payer: BC Managed Care – PPO

## 2020-06-16 ENCOUNTER — Other Ambulatory Visit: Payer: Self-pay

## 2020-06-16 VITALS — BP 131/69 | HR 83 | Resp 16 | Ht 65.0 in | Wt 214.0 lb

## 2020-06-16 DIAGNOSIS — Z01419 Encounter for gynecological examination (general) (routine) without abnormal findings: Secondary | ICD-10-CM | POA: Diagnosis not present

## 2020-06-16 DIAGNOSIS — Z8659 Personal history of other mental and behavioral disorders: Secondary | ICD-10-CM | POA: Diagnosis not present

## 2020-06-16 MED ORDER — VENLAFAXINE HCL 75 MG PO TABS: 75.0000 mg | ORAL_TABLET | Freq: Two times a day (BID) | ORAL | 1 refills | Status: DC

## 2020-06-16 MED ORDER — VENLAFAXINE HCL 75 MG PO TABS: 75.0000 mg | ORAL_TABLET | Freq: Two times a day (BID) | ORAL | 4 refills | Status: DC

## 2020-06-16 NOTE — Progress Notes (Addendum)
Subjective:     [name] is a 60 y.o. female here at [facility] for a routine exam.  Current complaints: She has no complaints. Personal health questionnaire reviewed: yes.  Do you have a primary care provider? No Do you feel safe at home? Yes   Health Maintenance Due  Topic Date Due  . HIV Screening  Never done  . Hepatitis C Screening  Never done  . PAP SMEAR-Modifier  Never done  . COVID-19 Vaccine (3 - Booster for Pfizer series) 11/14/2019    Risk factors for chronic health problems: Obesity Smoking: Denies Alchohol/how much: Socially Pt BMI: Body mass index is 35.61 kg/m.   Gynecologic History No LMP recorded. Patient has had a hysterectomy. Contraception: status post hysterectomy Last Pap: 2011. Results were: normal Last mammogram: 03/2019. Results were: normal Sex: sexually active with 1 partner. No concerns with sexual health.   Obstetric History OB History  Gravida Para Term Preterm AB Living  2 2 2    0 2  SAB IAB Ectopic Multiple Live Births      0   2    # Outcome Date GA Lbr Len/2nd Weight Sex Delivery Anes PTL Lv  2 Term     F CS-Unspec  N LIV  1 Term     F CS-Unspec  N LIV   The following portions of the patient's history were reviewed and updated as appropriate: allergies, current medications, past family history, past medical history, past social history, past surgical history and problem list.  Review of Systems Pertinent items are noted in HPI.    Objective:   BP 131/69   Pulse 83   Resp 16   Ht 5\' 5"  (1.651 m)   Wt 214 lb (97.1 kg)   BMI 35.61 kg/m  VS reviewed, nursing note reviewed,  Constitutional: well developed, well nourished, no distress HEENT: normocephalic CV: normal rate Pulm/chest wall: normal effort Breast Exam: performed with chaperone: right breast normal without mass, skin or nipple changes or axillary nodes, left breast normal without mass, skin or nipple changes or axillary nodes Abdomen: soft, non-tender,  approximate 5-6cm firm, smooth, fixed, nontender mass in RUQ (pt says related to lap band procedure) Neuro: alert and oriented x 3 Skin: warm, dry Psych: affect normal Pelvic exam: Deferred Bimanual exam: performed with chaperone, cervix absent, adnexa without tenderness, enlargement, or mass   Assessment/Plan:   1. Well woman exam  - No concerns today - Encouraged patient to establish care with PCP; referral placed - Up to date on colonoscopy, mammogram referral placed - Encouraged to incorporate healthy diet and exercise regimen to daily life  - MM Digital Screening; Future - Ambulatory referral to [facility]  2. History of anxiety - Mood stable.  - Patient spends time with grandchildren as much as possible and works at [employer] which she "loves and gets so much joy out of". - Refill sent for effexor. Pt was encouraged to establish care with PCP to continue medication management  - venlafaxine (EFFEXOR) 75 MG tablet; Take 1 tablet (75 mg total) by mouth 2 (two) times daily.  Dispense: 180 tablet; Refill: 1  3. History of depression    Follow up in: 1 year for well woman exam or as needed.   Renee Harder, CNM  06/16/20 11:07 AM

## 2020-06-19 DIAGNOSIS — Z20822 Contact with and (suspected) exposure to covid-19: Secondary | ICD-10-CM | POA: Diagnosis not present

## 2020-06-28 ENCOUNTER — Encounter: Payer: Self-pay | Admitting: Emergency Medicine

## 2020-06-28 ENCOUNTER — Other Ambulatory Visit: Payer: Self-pay

## 2020-06-28 ENCOUNTER — Emergency Department (INDEPENDENT_AMBULATORY_CARE_PROVIDER_SITE_OTHER)
Admission: EM | Admit: 2020-06-28 | Discharge: 2020-06-28 | Disposition: A | Discharge status: Home / Self Care | Payer: BC Managed Care – PPO | Source: Home / Self Care

## 2020-06-28 DIAGNOSIS — R319 Hematuria, unspecified: Secondary | ICD-10-CM

## 2020-06-28 DIAGNOSIS — R300 Dysuria: Secondary | ICD-10-CM

## 2020-06-28 DIAGNOSIS — R3915 Urgency of urination: Secondary | ICD-10-CM

## 2020-06-28 LAB — POCT URINALYSIS DIP (MANUAL ENTRY)
Glucose, UA: NEGATIVE mg/dL
Nitrite, UA: NEGATIVE
Protein Ur, POC: 30 mg/dL — AB
Spec Grav, UA: >=1.03 — AB (ref 1.010–1.025)
Urobilinogen, UA: 0.2 E.U./dL
pH, UA: 5.5 (ref 5.0–8.0)

## 2020-06-28 MED ORDER — SULFAMETHOXAZOLE-TRIMETHOPRIM 800-160 MG PO TABS: 1.0000 | ORAL_TABLET | Freq: Two times a day (BID) | ORAL | 0 refills | Status: AC

## 2020-06-28 MED ORDER — PHENAZOPYRIDINE HCL 200 MG PO TABS: 200.0000 mg | ORAL_TABLET | Freq: Three times a day (TID) | ORAL | 0 refills | Status: AC

## 2020-06-28 NOTE — ED Provider Notes (Signed)
[facility]    CSN: [id number] Arrival date & time: 06/28/20  1606      History   Chief Complaint Chief Complaint  Patient presents with  . Urinary Urgency    HPI [name] is a 60 y.o. female.   HPI 60 year old female presents with dysuria, urgency, frequency, and hematuria x 3 days.  Past Medical History:  Diagnosis Date  . Anxiety   . Chills   . Cough   . Obesity   . Unintentional weight loss   . Vomiting   . Weakness     Patient Active Problem List   Diagnosis Date Noted  . Anxiety     Past Surgical History:  Procedure Laterality Date  . ABDOMINAL HYSTERECTOMY  2006   FIBROIDS  . [place]  . LAPAROSCOPIC GASTRIC BANDING  12/02/2006    OB History    Gravida  2   Para  2   Term  2   Preterm      AB  0   Living  2     SAB      IAB      Ectopic  0   Multiple      Live Births  2            Home Medications    Prior to Admission medications   Medication Sig Start Date End Date Taking? Authorizing Provider  phenazopyridine (PYRIDIUM) 200 MG tablet Take 1 tablet (200 mg total) by mouth 3 (three) times daily for 5 days. 06/28/20 07/03/20 Yes Eliezer Lofts, FNP  sulfamethoxazole-trimethoprim (BACTRIM DS) 800-160 MG tablet Take 1 tablet by mouth 2 (two) times daily for 3 days. 06/28/20 07/01/20 Yes Eliezer Lofts, FNP  venlafaxine (EFFEXOR) 75 MG tablet Take 1 tablet (75 mg total) by mouth 2 (two) times daily. 06/16/20  Yes Renee Harder, CNM    Family History Family History  Problem Relation Age of Onset  . Cancer Cousin        breast - mother's cousin  . Diabetes Mother   . Hypertension Mother   . Hypertension Father   . Stomach cancer Paternal Grandmother   . Colon cancer Neg Hx   . Esophageal cancer Neg Hx   . Rectal cancer Neg Hx     Social History Social History   Tobacco Use  . Smoking status: Former Smoker    Quit date: 04/02/1993    Years since quitting: 27.2  . Smokeless tobacco:  Never Used  Vaping Use  . Vaping Use: Never used  Substance Use Topics  . Alcohol use: Not Currently  . Drug use: No     Allergies   Patient has no known allergies.   Review of Systems Review of Systems  Constitutional: Negative.   HENT: Negative.   Respiratory: Negative.   Cardiovascular: Negative.   Gastrointestinal: Negative.   Genitourinary: Positive for dysuria, frequency, hematuria and urgency.  Musculoskeletal: Negative.   Skin: Negative.   Neurological: Negative.      Physical Exam Triage Vital Signs ED Triage Vitals  Enc Vitals Group     BP 06/28/20 1631 123/72     Pulse Rate 06/28/20 1631 78     Resp --      Temp 06/28/20 1631 99 F (37.2 C)     Temp Source 06/28/20 1631 Oral     SpO2 06/28/20 1631 98 %     Weight --      Height --  Head Circumference --      Peak Flow --      Pain Score 06/28/20 1633 8     Pain Loc --      Pain Edu? --      Excl. in [place]? --    No data found.  Updated Vital Signs BP 123/72 (BP Location: Left Arm)   Pulse 78   Temp 99 F (37.2 C) (Oral)   SpO2 98%      Physical Exam Constitutional:      General: She is not in acute distress.    Appearance: She is obese. She is not ill-appearing.  HENT:     Head: Normocephalic and atraumatic.     Mouth/Throat:     Mouth: Mucous membranes are moist.     Pharynx: Oropharynx is clear.  Eyes:     Extraocular Movements: Extraocular movements intact.     Conjunctiva/sclera: Conjunctivae normal.     Pupils: Pupils are equal, round, and reactive to light.  Cardiovascular:     Rate and Rhythm: Normal rate and regular rhythm.     Pulses: Normal pulses.     Heart sounds: Normal heart sounds.  Pulmonary:     Effort: Pulmonary effort is normal.     Breath sounds: Normal breath sounds.     Comments: No adventitious breath sounds noted Abdominal:     Tenderness: There is no right CVA tenderness or left CVA tenderness.  Musculoskeletal:        General: Normal range of  motion.     Cervical back: Normal range of motion and neck supple. No tenderness.  Lymphadenopathy:     Cervical: No cervical adenopathy.  Skin:    General: Skin is warm and dry.  Neurological:     General: No focal deficit present.     Mental Status: She is alert and oriented to person, place, and time.  Psychiatric:        Mood and Affect: Mood normal.        Behavior: Behavior normal.      UC Treatments / Results  Labs (all labs ordered are listed, but only abnormal results are displayed) Labs Reviewed  POCT URINALYSIS DIP (MANUAL ENTRY) - Abnormal; Notable for the following components:      Result Value   Color, UA other (*)    Clarity, UA cloudy (*)    Bilirubin, UA small (*)    Ketones, POC UA trace (5) (*)    Spec Grav, UA >=1.030 (*)    Blood, UA moderate (*)    Protein Ur, POC =30 (*)    Leukocytes, UA Small (1+) (*)    All other components within normal limits  URINE CULTURE    EKG   Radiology No results found.  Procedures Procedures (including critical care time)  Medications Ordered in UC Medications - No data to display  Initial Impression / Assessment and Plan / UC Course  I have reviewed the triage vital signs and the nursing notes.  Pertinent labs & imaging results that were available during my care of the patient were reviewed by me and considered in my medical decision making (see chart for details).     MDM: 1.  Dysuria, 2.  Hematuria.  Urine culture ordered.  Patient discharged home, hemodynamically stable. Final Clinical Impressions(s) / UC Diagnoses   Final diagnoses:  Urinary urgency  Dysuria  Hematuria, unspecified type     Discharge Instructions     Advised patient to take medication  as directed with food to completion.  Advised patient may take Pyridium daily, as needed for dysuria.  Advised/encourage patient decrease daily water intake to 32 to 48 ounces daily.  Advised we will follow-up with urine culture results once  received.    ED Prescriptions    Medication Sig Dispense Auth. Provider   sulfamethoxazole-trimethoprim (BACTRIM DS) 800-160 MG tablet Take 1 tablet by mouth 2 (two) times daily for 3 days. 6 tablet Eliezer Lofts, FNP   phenazopyridine (PYRIDIUM) 200 MG tablet Take 1 tablet (200 mg total) by mouth 3 (three) times daily for 5 days. 15 tablet Eliezer Lofts, FNP     PDMP not reviewed this encounter.   Eliezer Lofts, [place] 06/28/20 1720

## 2020-06-28 NOTE — ED Triage Notes (Signed)
Patient c/o urinary urgency, frequency and dysuria, hematuria x 3 days.  Patient denies any OTC pain meds.  Patient is having leg cramps believed to be from dehydration.

## 2020-06-28 NOTE — Discharge Instructions (Signed)
Advised patient to take medication as directed with food to completion.  Advised patient may take Pyridium daily, as needed for dysuria.  Advised/encourage patient decrease daily water intake to 32 to 48 ounces daily.  Advised we will follow-up with urine culture results once received.

## 2020-06-30 LAB — URINE CULTURE
MICRO NUMBER:: 11959772
SPECIMEN QUALITY:: ADEQUATE

## 2020-07-14 ENCOUNTER — Ambulatory Visit: Payer: BC Managed Care – PPO | Admitting: Family Medicine

## 2020-10-26 ENCOUNTER — Other Ambulatory Visit: Payer: Self-pay

## 2020-10-26 ENCOUNTER — Ambulatory Visit (INDEPENDENT_AMBULATORY_CARE_PROVIDER_SITE_OTHER): Payer: BC Managed Care – PPO

## 2020-10-26 DIAGNOSIS — Z1231 Encounter for screening mammogram for malignant neoplasm of breast: Secondary | ICD-10-CM | POA: Diagnosis not present

## 2020-10-26 DIAGNOSIS — Z01419 Encounter for gynecological examination (general) (routine) without abnormal findings: Secondary | ICD-10-CM

## 2020-11-09 ENCOUNTER — Encounter: Payer: Self-pay | Admitting: Family Medicine

## 2020-11-09 ENCOUNTER — Ambulatory Visit (INDEPENDENT_AMBULATORY_CARE_PROVIDER_SITE_OTHER): Payer: BC Managed Care – PPO | Admitting: Family Medicine

## 2020-11-09 VITALS — BP 129/52 | HR 91 | Ht 64.0 in | Wt 214.0 lb

## 2020-11-09 DIAGNOSIS — H60502 Unspecified acute noninfective otitis externa, left ear: Secondary | ICD-10-CM

## 2020-11-09 DIAGNOSIS — N3000 Acute cystitis without hematuria: Secondary | ICD-10-CM | POA: Diagnosis not present

## 2020-11-09 DIAGNOSIS — F419 Anxiety disorder, unspecified: Secondary | ICD-10-CM

## 2020-11-09 DIAGNOSIS — R300 Dysuria: Secondary | ICD-10-CM | POA: Diagnosis not present

## 2020-11-09 DIAGNOSIS — H6090 Unspecified otitis externa, unspecified ear: Secondary | ICD-10-CM | POA: Insufficient documentation

## 2020-11-09 LAB — POCT URINALYSIS DIP (CLINITEK)
Bilirubin, UA: NEGATIVE
Glucose, UA: NEGATIVE mg/dL
Ketones, POC UA: NEGATIVE mg/dL
Nitrite, UA: NEGATIVE
POC PROTEIN,UA: NEGATIVE
Spec Grav, UA: 1.025 (ref 1.010–1.025)
Urobilinogen, UA: 1 E.U./dL
pH, UA: 6 (ref 5.0–8.0)

## 2020-11-09 MED ORDER — NITROFURANTOIN MONOHYD MACRO 100 MG PO CAPS: 100.0000 mg | ORAL_CAPSULE | Freq: Two times a day (BID) | ORAL | 0 refills | Status: AC

## 2020-11-09 MED ORDER — NEOMYCIN-POLYMYXIN-HC 3.5-10000-1 OT SOLN: 4.0000 [drp] | Freq: Four times a day (QID) | OTIC | 0 refills | Status: DC

## 2020-11-09 NOTE — Patient Instructions (Addendum)
Great to meet you today.   Start drops for ear infection.   Start nitrofurantoin for UTI. Let me know if not improving.

## 2020-11-09 NOTE — Assessment & Plan Note (Signed)
She is taking Effexor.  Stable at current strength.

## 2020-11-09 NOTE — Progress Notes (Signed)
Drema Halon - 60 y.o. female MRN [id number]  Date of birth: 09-Mar-1960  Subjective Chief Complaint  Patient presents with   Establish Care    HPI [name] is a 60 year old female here today for initial visit to establish care.  She has not had a primary care doctor and quite some time.  She does have a GYN that has been handling a lot of her primary care needs.  She does have a history of anxiety is currently prescribed Effexor.  This works pretty well for her.  She has been on this for several years.  She has complaint of left ear pain.  She has had this for a couple of weeks.  Initially felt like there was fluid in her ear now having throbbing pain.  She has not noticed any drainage from the ear.  She denies fever or chills.  She also reports increased urinary frequency and dysuria.  This started a few weeks ago and has improved slightly but still present.  She denies abdominal pain or back/flank pain.  She has not had associated nausea or fever.  ROS:  A comprehensive ROS was completed and negative except as noted per HPI  No Known Allergies  Past Medical History:  Diagnosis Date   Anxiety    Chills    Cough    Obesity    Unintentional weight loss    Vomiting    Weakness     Past Surgical History:  Procedure Laterality Date   ABDOMINAL HYSTERECTOMY  2006   [place], 1994   LAPAROSCOPIC GASTRIC BANDING  12/02/2006    Social History   Socioeconomic History   Marital status: Married    Spouse name: Not on file   Number of children: Not on file   Years of education: Not on file   Highest education level: Not on file  Occupational History   Not on file  Tobacco Use   Smoking status: Former    Types: Cigarettes    Quit date: 04/02/1993    Years since quitting: 27.6   Smokeless tobacco: Never  Vaping Use   Vaping Use: Never used  Substance and Sexual Activity   Alcohol use: Not Currently   Drug use: No   Sexual activity: Yes     Birth control/protection: None    Comment: intercourse age 28,more than 5 sexual partners,des neg  Other Topics Concern   Not on file  Social History Narrative   Not on file   Social Determinants of Health   Financial Resource Strain: Not on file  Food Insecurity: Not on file  Transportation Needs: Not on file  Physical Activity: Not on file  Stress: Not on file  Social Connections: Not on file    Family History  Problem Relation Age of Onset   Cancer Cousin        breast - mother's cousin   Diabetes Mother    Hypertension Mother    Hypertension Father    Stomach cancer Paternal Grandmother    Colon cancer Neg Hx    Esophageal cancer Neg Hx    Rectal cancer Neg Hx     Health Maintenance  Topic Date Due   HIV Screening  Never done   Hepatitis C Screening  Never done   PAP SMEAR-Modifier  Never done   Zoster Vaccines- Shingrix (1 of 2) Never done   COVID-19 Vaccine (3 - Booster for Pfizer series) 11/14/2019   INFLUENZA VACCINE  08/28/2020   MAMMOGRAM  10/27/2022   TETANUS/TDAP  06/11/2026   COLONOSCOPY (Pts 45-50yrs Insurance coverage will need to be confirmed)  08/13/2026   HPV VACCINES  Aged Out     ----------------------------------------------------------------------------------------------------------------------------------------------------------------------------------------------------------------- Physical Exam BP (!) 129/52   Pulse 91   Ht 5\' 4"  (1.626 m)   Wt 214 lb (97.1 kg)   SpO2 100% Comment: on RA  BMI 36.73 kg/m   Physical Exam Constitutional:      Appearance: Normal appearance.  HENT:     Head: Normocephalic and atraumatic.     Right Ear: Tympanic membrane normal.     Left Ear: Tympanic membrane normal.     Ears:     Comments: Left canal with erythema and purulent discharge. Eyes:     General: No scleral icterus. Cardiovascular:     Rate and Rhythm: Normal rate.  Pulmonary:     Effort: Pulmonary effort is normal.     Breath  sounds: Normal breath sounds.  Musculoskeletal:     Cervical back: Neck supple.  Neurological:     General: No focal deficit present.     Mental Status: She is alert.  Psychiatric:        Mood and Affect: Mood normal.        Behavior: Behavior normal.    ------------------------------------------------------------------------------------------------------------------------------------------------------------------------------------------------------------------- Assessment and Plan  Acute cystitis Urinalysis with leukocytes and small amount of blood.  Sending for culture.  We will go ahead and cover empirically with Macrobid.  Anxiety She is taking Effexor.  Stable at current strength.  Otitis externa Start Cortisporin drops x7 days.   Meds ordered this encounter  Medications   neomycin-polymyxin-hydrocortisone (CORTISPORIN) OTIC solution    Sig: Place 4 drops into the left ear 4 (four) times daily.    Dispense:  10 mL    Refill:  0   nitrofurantoin, macrocrystal-monohydrate, (MACROBID) 100 MG capsule    Sig: Take 1 capsule (100 mg total) by mouth 2 (two) times daily for 5 days.    Dispense:  10 capsule    Refill:  0    Return for Please schedule annual exam.    This visit occurred during the SARS-CoV-2 public health emergency.  Safety protocols were in place, including screening questions prior to the visit, additional usage of staff PPE, and extensive cleaning of exam room while observing appropriate contact time as indicated for disinfecting solutions.

## 2020-11-09 NOTE — Assessment & Plan Note (Signed)
Start Cortisporin drops x7 days.

## 2020-11-09 NOTE — Assessment & Plan Note (Signed)
Urinalysis with leukocytes and small amount of blood.  Sending for culture.  We will go ahead and cover empirically with Macrobid.

## 2020-11-11 LAB — URINE CULTURE
MICRO NUMBER:: 12502159
SPECIMEN QUALITY:: ADEQUATE

## 2020-12-14 ENCOUNTER — Ambulatory Visit (INDEPENDENT_AMBULATORY_CARE_PROVIDER_SITE_OTHER): Payer: BC Managed Care – PPO | Admitting: Family Medicine

## 2020-12-14 ENCOUNTER — Encounter: Payer: Self-pay | Admitting: Family Medicine

## 2020-12-14 VITALS — BP 127/55 | HR 76 | Temp 97.5°F | Ht 64.75 in | Wt 213.2 lb

## 2020-12-14 DIAGNOSIS — Z0000 Encounter for general adult medical examination without abnormal findings: Secondary | ICD-10-CM | POA: Diagnosis not present

## 2020-12-14 DIAGNOSIS — Z13220 Encounter for screening for lipoid disorders: Secondary | ICD-10-CM | POA: Diagnosis not present

## 2020-12-14 DIAGNOSIS — F419 Anxiety disorder, unspecified: Secondary | ICD-10-CM

## 2020-12-14 NOTE — Patient Instructions (Signed)
Preventive Care 59-59 Years Old, Female Preventive care refers to lifestyle choices and visits with your health care provider that can promote health and wellness. Preventive care visits are also called wellness exams. What can I expect for my preventive care visit? Counseling Your health care provider may ask you questions about your: Medical history, including: Past medical problems. Family medical history. Pregnancy history. Current health, including: Menstrual cycle. Method of birth control. Emotional well-being. Home life and relationship well-being. Sexual activity and sexual health. Lifestyle, including: Alcohol, nicotine or tobacco, and drug use. Access to firearms. Diet, exercise, and sleep habits. Work and work Statistician. Sunscreen use. Safety issues such as seatbelt and bike helmet use. Physical exam Your health care provider will check your: Height and weight. These may be used to calculate your BMI (body mass index). BMI is a measurement that tells if you are at a healthy weight. Waist circumference. This measures the distance around your waistline. This measurement also tells if you are at a healthy weight and may help predict your risk of certain diseases, such as type 2 diabetes and high blood pressure. Heart rate and blood pressure. Body temperature. Skin for abnormal spots. What immunizations do I need? Vaccines are usually given at various ages, according to a schedule. Your health care provider will recommend vaccines for you based on your age, medical history, and lifestyle or other factors, such as travel or where you work. What tests do I need? Screening Your health care provider may recommend screening tests for certain conditions. This may include: Lipid and cholesterol levels. Diabetes screening. This is done by checking your blood sugar (glucose) after you have not eaten for a while (fasting). Pelvic exam and Pap test. Hepatitis B test. Hepatitis C  test. HIV (human immunodeficiency virus) test. STI (sexually transmitted infection) testing, if you are at risk. Lung cancer screening. Colorectal cancer screening. Mammogram. Talk with your health care provider about when you should start having regular mammograms. This may depend on whether you have a family history of breast cancer. BRCA-related cancer screening. This may be done if you have a family history of breast, ovarian, tubal, or peritoneal cancers. Bone density scan. This is done to screen for osteoporosis. Talk with your health care provider about your test results, treatment options, and if necessary, the need for more tests. Follow these instructions at home: Eating and drinking  Eat a diet that includes fresh fruits and vegetables, whole grains, lean protein, and low-fat dairy products. Take vitamin and mineral supplements as recommended by your health care provider. Do not drink alcohol if: Your health care provider tells you not to drink. You are pregnant, may be pregnant, or are planning to become pregnant. If you drink alcohol: Limit how much you have to 0-1 drink a day. Know how much alcohol is in your drink. In the [place] one drink equals one 12 oz bottle of beer (355 mL), one 5 oz glass of wine (148 mL), or one 1 oz glass of hard liquor (44 mL). Lifestyle Brush your teeth every morning and night with fluoride toothpaste. Floss one time each day. Exercise for at least 30 minutes 5 or more days each week. Do not use any products that contain nicotine or tobacco. These products include cigarettes, chewing tobacco, and vaping devices, such as e-cigarettes. If you need help quitting, ask your health care provider. Do not use drugs. If you are sexually active, practice safe sex. Use a condom or other form of protection to prevent  STIs. If you do not wish to become pregnant, use a form of birth control. If you plan to become pregnant, see your health care provider for a  prepregnancy visit. Take aspirin only as told by your health care provider. Make sure that you understand how much to take and what form to take. Work with your health care provider to find out whether it is safe and beneficial for you to take aspirin daily. Find healthy ways to manage stress, such as: Meditation, yoga, or listening to music. Journaling. Talking to a trusted person. Spending time with friends and family. Minimize exposure to UV radiation to reduce your risk of skin cancer. Safety Always wear your seat belt while driving or riding in a vehicle. Do not drive: If you have been drinking alcohol. Do not ride with someone who has been drinking. When you are tired or distracted. While texting. If you have been using any mind-altering substances or drugs. Wear a helmet and other protective equipment during sports activities. If you have firearms in your house, make sure you follow all gun safety procedures. Seek help if you have been physically or sexually abused. What's next? Visit your health care provider once a year for an annual wellness visit. Ask your health care provider how often you should have your eyes and teeth checked. Stay up to date on all vaccines. This information is not intended to replace advice given to you by your health care provider. Make sure you discuss any questions you have with your health care provider. Document Revised: 07/12/2020 Document Reviewed: 07/12/2020 Elsevier Patient Education  [employer].

## 2020-12-14 NOTE — Progress Notes (Signed)
[name] - 60 y.o. female MRN [id number]  Date of birth: September 14, 1960  Subjective Chief Complaint  Patient presents with   Annual Exam    HPI [name] is a 60 year old female here today for annual exam.  She reports she is doing well at this time.  She has a non-smoker.  She consumes alcohol occasionally.  She has had flu shot this year. She is up-to-date on colon cancer screening, Pap and mammogram.  She does walk occasionally for exercise.  She has had some difficulty with losing weight and is interested in trying [facility].  Review of Systems  Constitutional:  Negative for chills, fever, malaise/fatigue and weight loss.  HENT:  Negative for congestion, ear pain and sore throat.   Eyes:  Negative for blurred vision, double vision and pain.  Respiratory:  Negative for cough and shortness of breath.   Cardiovascular:  Negative for chest pain and palpitations.  Gastrointestinal:  Negative for abdominal pain, blood in stool, constipation, heartburn and nausea.  Genitourinary:  Negative for dysuria and urgency.  Musculoskeletal:  Negative for joint pain and myalgias.  Neurological:  Negative for dizziness and headaches.  Endo/Heme/Allergies:  Does not bruise/bleed easily.  Psychiatric/Behavioral:  Negative for depression. The patient is not nervous/anxious and does not have insomnia.     No Known Allergies  Past Medical History:  Diagnosis Date   Anxiety    Chills    Cough    Obesity    Unintentional weight loss    Vomiting    Weakness     Past Surgical History:  Procedure Laterality Date   ABDOMINAL HYSTERECTOMY  2006   [place], 1994   LAPAROSCOPIC GASTRIC BANDING  12/02/2006    Social History   Socioeconomic History   Marital status: Married    Spouse name: Not on file   Number of children: Not on file   Years of education: Not on file   Highest education level: Not on file  Occupational History   Not on file  Tobacco Use    Smoking status: Former    Types: Cigarettes    Quit date: 04/02/1993    Years since quitting: 27.7   Smokeless tobacco: Never  Vaping Use   Vaping Use: Never used  Substance and Sexual Activity   Alcohol use: Not Currently   Drug use: No   Sexual activity: Yes    Birth control/protection: None    Comment: intercourse age 37,more than 5 sexual partners,des neg  Other Topics Concern   Not on file  Social History Narrative   Not on file   Social Determinants of Health   Financial Resource Strain: Not on file  Food Insecurity: Not on file  Transportation Needs: Not on file  Physical Activity: Not on file  Stress: Not on file  Social Connections: Not on file    Family History  Problem Relation Age of Onset   Cancer Cousin        breast - mother's cousin   Diabetes Mother    Hypertension Mother    Hypertension Father    Stomach cancer Paternal Grandmother    Colon cancer Neg Hx    Esophageal cancer Neg Hx    Rectal cancer Neg Hx     Health Maintenance  Topic Date Due   HIV Screening  Never done   Hepatitis C Screening  Never done   Zoster Vaccines- Shingrix (1 of 2) Never done   COVID-19 Vaccine (3 -  Booster for Pfizer series) 08/09/2019   MAMMOGRAM  10/27/2022   TETANUS/TDAP  06/11/2026   COLONOSCOPY (Pts 45-18yrs Insurance coverage will need to be confirmed)  08/13/2026   INFLUENZA VACCINE  Completed   Pneumococcal Vaccine 74-59 Years old  Aged Out   HPV VACCINES  Aged Out   PAP SMEAR-Modifier  Discontinued     ----------------------------------------------------------------------------------------------------------------------------------------------------------------------------------------------------------------- Physical Exam BP (!) 127/55 (BP Location: Right Arm, Patient Position: Sitting, Cuff Size: Normal)   Pulse 76   Temp (!) 97.5 F (36.4 C)   Ht 5' 4.75" (1.645 m)   Wt 213 lb 3.2 oz (96.7 kg)   SpO2 96%   BMI 35.75 kg/m   Physical  Exam Constitutional:      General: She is not in acute distress. HENT:     Head: Normocephalic and atraumatic.     Right Ear: Tympanic membrane and ear canal normal.     Left Ear: Tympanic membrane and ear canal normal.     Nose: Nose normal.  Eyes:     General: No scleral icterus.    Conjunctiva/sclera: Conjunctivae normal.  Neck:     Thyroid: No thyromegaly.  Cardiovascular:     Rate and Rhythm: Normal rate and regular rhythm.     Heart sounds: Normal heart sounds.  Pulmonary:     Effort: Pulmonary effort is normal.     Breath sounds: Normal breath sounds.  Abdominal:     General: Bowel sounds are normal. There is no distension.     Palpations: Abdomen is soft.     Tenderness: There is no abdominal tenderness. There is no guarding.  Musculoskeletal:        General: Normal range of motion.     Cervical back: Normal range of motion and neck supple.  Lymphadenopathy:     Cervical: No cervical adenopathy.  Skin:    General: Skin is warm and dry.     Findings: No rash.  Neurological:     General: No focal deficit present.     Mental Status: She is alert and oriented to person, place, and time.     Cranial Nerves: No cranial nerve deficit.     Coordination: Coordination normal.  Psychiatric:        Mood and Affect: Mood normal.        Behavior: Behavior normal.    ------------------------------------------------------------------------------------------------------------------------------------------------------------------------------------------------------------------- Assessment and Plan  Well adult exam Well adult Orders Placed This Encounter  Procedures   COMPLETE METABOLIC PANEL WITH GFR   CBC with Differential   Lipid Panel w/reflex Direct LDL   TSH  Screening: Per lab orders Immunizations: Up-to-date Anticipatory guidance/risk factor reduction: Recommendations per AVS.   No orders of the defined types were placed in this encounter.   No follow-ups on  file.    This visit occurred during the SARS-CoV-2 public health emergency.  Safety protocols were in place, including screening questions prior to the visit, additional usage of staff PPE, and extensive cleaning of exam room while observing appropriate contact time as indicated for disinfecting solutions.

## 2020-12-14 NOTE — Assessment & Plan Note (Signed)
Well adult Orders Placed This Encounter  Procedures  . COMPLETE METABOLIC PANEL WITH GFR  . CBC with Differential  . Lipid Panel w/reflex Direct LDL  . TSH  Screening: Per lab orders Immunizations: Up-to-date Anticipatory guidance/risk factor reduction: Recommendations per AVS.

## 2020-12-15 ENCOUNTER — Other Ambulatory Visit: Payer: Self-pay | Admitting: Family Medicine

## 2020-12-15 ENCOUNTER — Telehealth: Payer: Self-pay

## 2020-12-15 LAB — TSH: TSH: 1.68 mIU/L (ref 0.40–4.50)

## 2020-12-15 LAB — CBC WITH DIFFERENTIAL/PLATELET
Absolute Monocytes: 546 cells/uL (ref 200–950)
Basophils Absolute: 79 cells/uL (ref 0–200)
Basophils Relative: 0.9 %
Eosinophils Absolute: 106 cells/uL (ref 15–500)
Eosinophils Relative: 1.2 %
HCT: 43.9 % (ref 35.0–45.0)
Hemoglobin: 14.2 g/dL (ref 11.7–15.5)
Lymphs Abs: 1769 cells/uL (ref 850–3900)
MCH: 27.2 pg (ref 27.0–33.0)
MCHC: 32.3 g/dL (ref 32.0–36.0)
MCV: 83.9 fL (ref 80.0–100.0)
MPV: 10.6 fL (ref 7.5–12.5)
Monocytes Relative: 6.2 %
Neutro Abs: 6301 cells/uL (ref 1500–7800)
Neutrophils Relative %: 71.6 %
Platelets: 289 10*3/uL (ref 140–400)
RBC: 5.23 10*6/uL — ABNORMAL HIGH (ref 3.80–5.10)
RDW: 14.2 % (ref 11.0–15.0)
Total Lymphocyte: 20.1 %
WBC: 8.8 10*3/uL (ref 3.8–10.8)

## 2020-12-15 LAB — LIPID PANEL W/REFLEX DIRECT LDL
Cholesterol: 183 mg/dL (ref <200)
HDL: 56 mg/dL (ref >=50)
LDL Cholesterol (Calc): 113 mg/dL (calc) — ABNORMAL HIGH
Non-HDL Cholesterol (Calc): 127 mg/dL (calc) (ref <130)
Total CHOL/HDL Ratio: 3.3 (calc) (ref <5.0)
Triglycerides: 55 mg/dL (ref <150)

## 2020-12-15 LAB — COMPLETE METABOLIC PANEL WITH GFR
AG Ratio: 1.6 (calc) (ref 1.0–2.5)
ALT: 11 U/L (ref 6–29)
AST: 14 U/L (ref 10–35)
Albumin: 4.1 g/dL (ref 3.6–5.1)
Alkaline phosphatase (APISO): 76 U/L (ref 37–153)
BUN: 14 mg/dL (ref 7–25)
CO2: 29 mmol/L (ref 20–32)
Calcium: 9.3 mg/dL (ref 8.6–10.4)
Chloride: 105 mmol/L (ref 98–110)
Creat: 0.83 mg/dL (ref 0.50–1.03)
Globulin: 2.5 g/dL (calc) (ref 1.9–3.7)
Glucose, Bld: 80 mg/dL (ref 65–99)
Potassium: 4.5 mmol/L (ref 3.5–5.3)
Sodium: 141 mmol/L (ref 135–146)
Total Bilirubin: 0.7 mg/dL (ref 0.2–1.2)
Total Protein: 6.6 g/dL (ref 6.1–8.1)
eGFR: 81 mL/min/{1.73_m2} (ref >=60)

## 2020-12-15 MED ORDER — TIRZEPATIDE 5 MG/0.5ML ~~LOC~~ SOAJ: 5.0000 mg | SUBCUTANEOUS | 0 refills | Status: DC

## 2020-12-15 MED ORDER — TIRZEPATIDE 7.5 MG/0.5ML ~~LOC~~ SOAJ: 7.5000 mg | SUBCUTANEOUS | 0 refills | Status: DC

## 2020-12-15 MED ORDER — TIRZEPATIDE 2.5 MG/0.5ML ~~LOC~~ SOAJ: 2.5000 mg | SUBCUTANEOUS | 0 refills | Status: DC

## 2020-12-15 NOTE — Telephone Encounter (Signed)
Medication: tirzepatide Darcel Bayley)  Prior authorization submitted via CoverMyMeds on 12/15/2020 PA submission pending

## 2020-12-18 NOTE — Telephone Encounter (Signed)
Medication: tirzepatide Darcel Bayley)  Prior authorization determination received Medication has been denied Reason for denial:  "The [place]. Food and Drug Administration (FDA) has not approved this medication for your condition."

## 2020-12-18 NOTE — Telephone Encounter (Signed)
Please inform patient that [name] is not covered by her insurance without history of diabetes.  We can consider Wegovy or Saxenda

## 2020-12-19 ENCOUNTER — Other Ambulatory Visit (HOSPITAL_COMMUNITY): Payer: Self-pay

## 2020-12-19 ENCOUNTER — Other Ambulatory Visit (HOSPITAL_BASED_OUTPATIENT_CLINIC_OR_DEPARTMENT_OTHER): Payer: Self-pay

## 2020-12-19 MED ORDER — TIRZEPATIDE 2.5 MG/0.5ML ~~LOC~~ SOAJ
2.5000 mg | SUBCUTANEOUS | 0 refills | Status: DC
  Filled 2020-12-19 (×2): qty 2, 28d supply, fill #0

## 2020-12-19 MED ORDER — TIRZEPATIDE 5 MG/0.5ML ~~LOC~~ SOAJ
5.0000 mg | SUBCUTANEOUS | 0 refills | Status: DC
  Filled 2020-12-19 – 2021-02-01 (×2): qty 2, 28d supply, fill #0

## 2020-12-19 MED ORDER — TIRZEPATIDE 7.5 MG/0.5ML ~~LOC~~ SOAJ
7.5000 mg | SUBCUTANEOUS | 0 refills | Status: DC
  Filled 2020-12-19: qty 6, 84d supply, fill #0

## 2020-12-19 NOTE — Telephone Encounter (Signed)
Pt states her daughter has a $25 coupon for [employer]. Requesting the Rx be sent to [facility].

## 2020-12-19 NOTE — Addendum Note (Signed)
Addended by: Peggye Ley on: 12/19/2020 08:31 AM   Modules accepted: Orders

## 2021-01-16 ENCOUNTER — Other Ambulatory Visit: Payer: Self-pay | Admitting: Family Medicine

## 2021-01-16 ENCOUNTER — Other Ambulatory Visit (HOSPITAL_BASED_OUTPATIENT_CLINIC_OR_DEPARTMENT_OTHER): Payer: Self-pay

## 2021-01-16 MED ORDER — MOUNJARO 2.5 MG/0.5ML ~~LOC~~ SOAJ
2.5000 mg | SUBCUTANEOUS | 0 refills | Status: DC
  Filled 2021-01-16 – 2021-01-18 (×3): qty 2, 28d supply, fill #0

## 2021-01-18 ENCOUNTER — Other Ambulatory Visit (HOSPITAL_BASED_OUTPATIENT_CLINIC_OR_DEPARTMENT_OTHER): Payer: Self-pay

## 2021-01-18 NOTE — Telephone Encounter (Signed)
Medication: tirzepatide Darcel Bayley) Prior authorization submitted via CoverMyMeds on 01/18/2021 PA submission pending   This is because a new Rx was sent to the pharmacy.

## 2021-01-19 ENCOUNTER — Other Ambulatory Visit (HOSPITAL_BASED_OUTPATIENT_CLINIC_OR_DEPARTMENT_OTHER): Payer: Self-pay

## 2021-01-24 ENCOUNTER — Other Ambulatory Visit (HOSPITAL_BASED_OUTPATIENT_CLINIC_OR_DEPARTMENT_OTHER): Payer: Self-pay

## 2021-02-01 ENCOUNTER — Other Ambulatory Visit (HOSPITAL_BASED_OUTPATIENT_CLINIC_OR_DEPARTMENT_OTHER): Payer: Self-pay

## 2021-02-02 ENCOUNTER — Other Ambulatory Visit (HOSPITAL_BASED_OUTPATIENT_CLINIC_OR_DEPARTMENT_OTHER): Payer: Self-pay

## 2021-02-23 ENCOUNTER — Telehealth: Payer: Self-pay

## 2021-03-12 NOTE — Telephone Encounter (Signed)
PA's being submitted.

## 2021-04-06 ENCOUNTER — Other Ambulatory Visit (HOSPITAL_COMMUNITY): Payer: Self-pay

## 2021-05-30 ENCOUNTER — Telehealth: Payer: Self-pay

## 2021-05-30 NOTE — Telephone Encounter (Signed)
Left voicemail on pt's phone letting her know her annual is due soon and she can call office to schedule.  ?

## 2021-06-20 ENCOUNTER — Ambulatory Visit: Payer: BC Managed Care – PPO

## 2021-06-22 ENCOUNTER — Telehealth: Payer: Self-pay

## 2021-06-22 NOTE — Telephone Encounter (Signed)
Please contact patient and help her reschedule her Nurse Visit for UTI. Previous appt was Wednesday. She was unable to make the appt due to 2 sick children at home. Thanks

## 2021-06-22 NOTE — Telephone Encounter (Signed)
Pt is unable to come in today.  Pt given appt 06/26/2021 at [facility], CMA

## 2021-06-26 ENCOUNTER — Ambulatory Visit: Payer: BC Managed Care – PPO

## 2021-06-27 ENCOUNTER — Ambulatory Visit (INDEPENDENT_AMBULATORY_CARE_PROVIDER_SITE_OTHER): Payer: BC Managed Care – PPO | Admitting: Family Medicine

## 2021-06-27 VITALS — BP 135/44 | HR 83 | Temp 99.0°F

## 2021-06-27 DIAGNOSIS — N3000 Acute cystitis without hematuria: Secondary | ICD-10-CM | POA: Diagnosis not present

## 2021-06-27 DIAGNOSIS — R8290 Unspecified abnormal findings in urine: Secondary | ICD-10-CM | POA: Diagnosis not present

## 2021-06-27 DIAGNOSIS — R300 Dysuria: Secondary | ICD-10-CM

## 2021-06-27 LAB — POCT URINALYSIS DIPSTICK
Bilirubin, UA: NEGATIVE
Glucose, UA: NEGATIVE
Ketones, UA: NEGATIVE
Nitrite, UA: POSITIVE
Protein, UA: POSITIVE — AB
Spec Grav, UA: 1.025 (ref 1.010–1.025)
Urobilinogen, UA: 1 E.U./dL
pH, UA: 5.5 (ref 5.0–8.0)

## 2021-06-27 MED ORDER — NITROFURANTOIN MONOHYD MACRO 100 MG PO CAPS: 100.0000 mg | ORAL_CAPSULE | Freq: Two times a day (BID) | ORAL | 0 refills | Status: DC

## 2021-06-27 NOTE — Progress Notes (Signed)
UTI - will tx with macrobid.  UA pos of nitrates. Call if notter in one week.

## 2021-06-27 NOTE — Progress Notes (Signed)
Pt c/o dysuria, urinary frequency, urgency, and incontinence x 1 week.  Pt denies fever, chills, flank pain, and N&V.  Charyl Bigger, CMA

## 2021-06-28 ENCOUNTER — Ambulatory Visit: Payer: BC Managed Care – PPO

## 2021-06-29 LAB — CULTURE, URINE COMPREHENSIVE
MICRO NUMBER:: 13468835
SPECIMEN QUALITY:: ADEQUATE

## 2021-07-02 ENCOUNTER — Other Ambulatory Visit: Payer: Self-pay | Admitting: *Deleted

## 2021-07-02 DIAGNOSIS — R350 Frequency of micturition: Secondary | ICD-10-CM

## 2021-07-02 NOTE — Progress Notes (Signed)
Call patient: Urine culture came back positive for a bacteria called E. coli.  This is one of the most common urinary tract infections.  The antibiotic called nitrofurantoin that we placed you on should work to clear this up.  Please let us know if you are having any residual symptoms after you complete your antibiotics.

## 2021-09-19 ENCOUNTER — Telehealth: Payer: Self-pay | Admitting: *Deleted

## 2021-09-19 NOTE — Telephone Encounter (Signed)
Returned call from 09/19/2021 AM. Left patient a message to call and schedule annual.

## 2021-10-15 ENCOUNTER — Ambulatory Visit (INDEPENDENT_AMBULATORY_CARE_PROVIDER_SITE_OTHER): Payer: BC Managed Care – PPO | Admitting: Obstetrics & Gynecology

## 2021-10-15 ENCOUNTER — Encounter: Payer: Self-pay | Admitting: Obstetrics & Gynecology

## 2021-10-15 VITALS — BP 117/71 | HR 76 | Resp 16 | Ht 64.0 in | Wt 210.0 lb

## 2021-10-15 DIAGNOSIS — M8580 Other specified disorders of bone density and structure, unspecified site: Secondary | ICD-10-CM | POA: Diagnosis not present

## 2021-10-15 DIAGNOSIS — N3946 Mixed incontinence: Secondary | ICD-10-CM | POA: Diagnosis not present

## 2021-10-15 DIAGNOSIS — R32 Unspecified urinary incontinence: Secondary | ICD-10-CM | POA: Diagnosis not present

## 2021-10-15 DIAGNOSIS — Z780 Asymptomatic menopausal state: Secondary | ICD-10-CM

## 2021-10-15 DIAGNOSIS — Z8659 Personal history of other mental and behavioral disorders: Secondary | ICD-10-CM | POA: Diagnosis not present

## 2021-10-15 DIAGNOSIS — Z01419 Encounter for gynecological examination (general) (routine) without abnormal findings: Secondary | ICD-10-CM | POA: Diagnosis not present

## 2021-10-15 DIAGNOSIS — Z23 Encounter for immunization: Secondary | ICD-10-CM | POA: Diagnosis not present

## 2021-10-15 MED ORDER — VENLAFAXINE HCL 75 MG PO TABS: 75.0000 mg | ORAL_TABLET | Freq: Two times a day (BID) | ORAL | 4 refills | Status: DC

## 2021-10-15 NOTE — Progress Notes (Signed)
Subjective:     [name] is a 61 y.o. female here for a routine exam.  Current complaints: Both stress and urge incontinence.  The urge is worse than the stress incontinence symptoms.  Patient would like a refill on her Effexor.  She is raising her 2 grandchildren while her daughter is facing drug charges.  The Effexor helps take the "edge off ".  Patient is sexually active with no issues.  We reviewed signs and symptoms of ovarian cancer and she has none.  She had a good friend that recently died of the disease.  Patient does not take calcium or vitamin D.  She was diagnosed with osteopenia in 2018.  I reviewed pathology from her hysterectomy in 2004.  She did have her cervix removed and Pap smears are not indicated.   Gynecologic History No LMP recorded. Patient has had a hysterectomy. Contraception: post menopausal status Last Pap: n/a s/p hysterectomy.  Last mammogram: 2022. Results were: normal  Obstetric History OB History  Gravida Para Term Preterm AB Living  '2 2 2   '$ 0 2  SAB IAB Ectopic Multiple Live Births      0   2    # Outcome Date GA Lbr Len/2nd Weight Sex Delivery Anes PTL Lv  2 Term     F CS-Unspec  N LIV  1 Term     F CS-Unspec  N LIV    The following portions of the patient's history were reviewed and updated as appropriate: allergies, current medications, past family history, past medical history, past social history, past surgical history, and problem list.  Review of Systems Pertinent items noted in HPI and remainder of comprehensive ROS otherwise negative.      Objective:     Physical Exam Vitals:   10/15/21 1333  BP: 117/71  Pulse: 76  Resp: 16  Weight: 210 lb (95.3 kg)  Height: '5\' 4"'$  (1.626 m)   Vitals:  WNL General appearance: alert, cooperative and no distress  HEENT: Normocephalic, without obvious abnormality, atraumatic Eyes: negative Throat: lips, mucosa, and tongue normal; teeth and gums normal  Respiratory: Clear to auscultation  bilaterally  CV: Regular rate and rhythm  Breasts:  Normal appearance, no masses or tenderness, no nipple retraction or dimpling  GI: Soft, non-tender; bowel sounds normal; no masses,  no organomegaly  GU: External Genitalia:  Tanner V, no lesion Urethra:  No prolapse   Vagina: Pink, normal rugae, no blood or discharge  Cervix: Surgically absent  Uterus:  Surgically absent  Adnexa: Normal, no masses, non tender  Musculoskeletal: No edema, redness or tenderness in the calves or thighs  Skin: No lesions or rash  Lymphatic: Axillary adenopathy: none     Psychiatric: Normal mood and behavior    Assessment:    Healthy female exam.    Plan:  Osteopenia--calcium worksheet given.  Patient knows to consume 1200 mg of calcium a day along with Vitamin D Yearly mammograms Referral to urogynecology for stress and urge incontinence Flu shot today Colonoscopy up-to-date.  Next one due 2028

## 2021-10-15 NOTE — Progress Notes (Signed)
Routine well woman Sunscreen-yes Seatbelts-yes Dentist-Yes Brush and floss-yes Colonoscopy -7/18 Pap-hysterectomy Mammogram-9/22

## 2021-10-16 ENCOUNTER — Other Ambulatory Visit: Payer: Self-pay | Admitting: Obstetrics & Gynecology

## 2021-10-16 DIAGNOSIS — Z1231 Encounter for screening mammogram for malignant neoplasm of breast: Secondary | ICD-10-CM

## 2021-11-07 ENCOUNTER — Ambulatory Visit (INDEPENDENT_AMBULATORY_CARE_PROVIDER_SITE_OTHER): Payer: BC Managed Care – PPO

## 2021-11-07 DIAGNOSIS — Z1231 Encounter for screening mammogram for malignant neoplasm of breast: Secondary | ICD-10-CM | POA: Diagnosis not present

## 2021-11-07 DIAGNOSIS — Z780 Asymptomatic menopausal state: Secondary | ICD-10-CM

## 2021-11-07 DIAGNOSIS — M8580 Other specified disorders of bone density and structure, unspecified site: Secondary | ICD-10-CM

## 2021-11-07 DIAGNOSIS — M85852 Other specified disorders of bone density and structure, left thigh: Secondary | ICD-10-CM | POA: Diagnosis not present

## 2021-11-08 ENCOUNTER — Ambulatory Visit: Payer: BC Managed Care – PPO

## 2021-12-17 ENCOUNTER — Ambulatory Visit (INDEPENDENT_AMBULATORY_CARE_PROVIDER_SITE_OTHER): Payer: BC Managed Care – PPO | Admitting: Family Medicine

## 2021-12-17 ENCOUNTER — Encounter: Payer: Self-pay | Admitting: Family Medicine

## 2021-12-17 VITALS — BP 117/64 | HR 85 | Ht 64.0 in | Wt 210.0 lb

## 2021-12-17 DIAGNOSIS — M7050 Other bursitis of knee, unspecified knee: Secondary | ICD-10-CM

## 2021-12-17 MED ORDER — PREDNISONE 50 MG PO TABS: ORAL_TABLET | ORAL | 0 refills | Status: DC

## 2021-12-17 NOTE — Assessment & Plan Note (Addendum)
Symptoms consistent with Pez anserine pain.  She may continue topical Voltaren.  Adding prednisone 50 mg daily x5 days.  Given handout for home exercise program.  She will let me know if not improving over the next couple weeks.  Recommend that she see Dr. Dianah Field for consultation if she is not seeing improvement

## 2021-12-17 NOTE — Progress Notes (Signed)
[name] - 61 y.o. female MRN [id number]  Date of birth: 05-07-1960  Subjective Chief Complaint  Patient presents with   Knee Injury    HPI [name] is a 61 y.o. female here today with complaint of R knee pain. She has had pain for a few weeks.  She did start walking more and on the first day she did about 2 miles after being fairly sedentary for quite some time.  Pain is located along the distal medial portion of the knee.  She denies radiation into the lower leg, numbness or tingling.  She has not noted significant swelling or bruising.  She has tried some icing to the area as well as Voltaren gel.  Additionally she is taking 60 mg of ibuprofen 3 times per day with limited relief.  ROS:  A comprehensive ROS was completed and negative except as noted per HPI  No Known Allergies  Past Medical History:  Diagnosis Date   Anxiety    Chills    Cough    Obesity    Unintentional weight loss    Vomiting    Weakness     Past Surgical History:  Procedure Laterality Date   ABDOMINAL HYSTERECTOMY  2006   [place], 1994   LAPAROSCOPIC GASTRIC BANDING  12/02/2006    Social History   Socioeconomic History   Marital status: Married    Spouse name: Not on file   Number of children: Not on file   Years of education: Not on file   Highest education level: Not on file  Occupational History   Not on file  Tobacco Use   Smoking status: Former    Types: Cigarettes    Quit date: 04/02/1993    Years since quitting: 28.7   Smokeless tobacco: Never  Vaping Use   Vaping Use: Never used  Substance and Sexual Activity   Alcohol use: Not Currently   Drug use: No   Sexual activity: Yes    Birth control/protection: None    Comment: intercourse age 89,more than 5 sexual partners,des neg  Other Topics Concern   Not on file  Social History Narrative   Not on file   Social Determinants of Health   Financial Resource Strain: Not on file  Food Insecurity:  Not on file  Transportation Needs: Not on file  Physical Activity: Not on file  Stress: Not on file  Social Connections: Not on file    Family History  Problem Relation Age of Onset   Cancer Cousin        breast - mother's cousin   Diabetes Mother    Hypertension Mother    Hypertension Father    Stomach cancer Paternal Grandmother    Colon cancer Neg Hx    Esophageal cancer Neg Hx    Rectal cancer Neg Hx     Health Maintenance  Topic Date Due   COVID-19 Vaccine (3 - Pfizer series) 01/02/2022 (Originally 08/09/2019)   Zoster Vaccines- Shingrix (1 of 2) 03/19/2022 (Originally 03/30/2011)   Hepatitis C Screening  12/18/2022 (Originally 03/30/1979)   HIV Screening  12/18/2022 (Originally 03/29/1976)   MAMMOGRAM  11/08/2023   COLONOSCOPY (Pts 45-49yr Insurance coverage will need to be confirmed)  08/13/2026   INFLUENZA VACCINE  Completed   HPV VACCINES  Aged Out   PAP SMEAR-Modifier  Discontinued     ----------------------------------------------------------------------------------------------------------------------------------------------------------------------------------------------------------------- Physical Exam BP 117/64 (BP Location: Left Arm, Patient Position: Sitting, Cuff Size: Large)  Pulse 85   Ht '5\' 4"'$  (1.626 m)   Wt 210 lb (95.3 kg)   SpO2 97%   BMI 36.05 kg/m   Physical Exam Constitutional:      Appearance: Normal appearance.  HENT:     Head: Normocephalic and atraumatic.  Eyes:     General: No scleral icterus. Musculoskeletal:     Cervical back: Neck supple.     Comments: Tenderness palpation along the Pes anserine at the distal medial knee area.  There is no significant swelling.  No joint line tenderness.  Range of motion is fairly good.    Neurological:     Mental Status: She is alert.  Psychiatric:        Mood and Affect: Mood normal.        Behavior: Behavior normal.      ------------------------------------------------------------------------------------------------------------------------------------------------------------------------------------------------------------------- Assessment and Plan  Pes anserine bursitis Symptoms consistent with Pez anserine pain.  She may continue topical Voltaren.  Adding prednisone 50 mg daily x5 days.  Given handout for home exercise program.  She will let me know if not improving over the next couple weeks.  Recommend that she see Dr. Dianah Field for consultation if she is not seeing improvement   Meds ordered this encounter  Medications   predniSONE (DELTASONE) 50 MG tablet    Sig: Take '50mg'$  daily x5 days.    Dispense:  5 tablet    Refill:  0    No follow-ups on file.    This visit occurred during the SARS-CoV-2 public health emergency.  Safety protocols were in place, including screening questions prior to the visit, additional usage of staff PPE, and extensive cleaning of exam room while observing appropriate contact time as indicated for disinfecting solutions.

## 2021-12-17 NOTE — Patient Instructions (Signed)
Pes Anserine Bursitis  The pes anserine is an area on the inside of your knee, just below the joint, where the muscles are attached to the bone by tendons. This area is cushioned by a fluid-filled sac (bursa). Pes anserine bursitis is a condition that happens when the bursa gets swollen and irritated and causes knee pain. What are the causes? This condition may be caused by: Making the same movement over and over. A direct hit (trauma) to the inside of the leg below the knee joint. What increases the risk? You are more likely to develop this condition if you: Are a runner. Play sports that involve a lot of running and quick side-to-side movements (cutting). Are an athlete who plays contact sports. Swim using an inward angle of the knee, such as with the breaststroke. Have tight hamstring muscles. Are a woman. Are overweight. Have flat feet. Have diabetes or osteoarthritis. What are the signs or symptoms? Symptoms of this condition include: Knee pain that gets better with rest and worse with activities like climbing stairs, walking, running, or getting in and out of a chair. Swelling. Warmth. Tenderness when pressing at the inside of the lower leg, just below the knee joint. How is this diagnosed? This condition is usually diagnosed based on: Your symptoms. Your medical history. A physical exam. During your physical exam, your health care provider will press on the tendon attachment to see if you feel pain. Your health care provider will check your hip and knee motion and strength. In rare cases, tests are used to check for swelling and fluid buildup in the bursa and to look at muscles, bones, and tendons. These tests might include: X-rays. MRI. Ultrasound. How is this treated? This condition may be treated by: Resting your knee. You may be told to raise (elevate) your knee while sitting or lying down. Avoiding activities that cause pain. Icing the inside of your knee. Applying  heat to your knee. Wearing an elastic wrap or compression knee sleeve to support your knee. Sleeping with a pillow between your knees. This will cushion your injured knee. Taking medicine to reduce pain and swelling. Getting corticosteroid injections into the knee to reduce pain and swelling. Doing strengthening and stretching exercises (physical therapy). If these treatments do not work or if the condition keeps coming back, you may need to have surgery to remove the bursa. Follow these instructions at home: If you have a removable compression wrap or sleeve: Wear it as told by your health care provider. Remove it only as told by your health care provider. Loosen the wrap or sleeve if your foot or toes tingle, become numb, or turn cold and blue. Keep the wrap or sleeve clean. If the wrap or sleeve is not waterproof: Remove it if allowed by your health care provider. Do not let it get wet. Cover it with a watertight covering when you take a bath or shower if you must wear it. Managing pain, stiffness, and swelling     If directed, put ice on the injured area. Put ice in a plastic bag. Place a towel between your skin and the bag. Leave the ice on for 20 minutes, 2-3 times a day. Remove the ice if your skin turns bright red. This is very important. If you cannot feel pain, heat, or cold, you have a greater risk of damage to the area. Move your toes often to reduce stiffness and swelling. Elevate the injured area above the level of your heart while you  are sitting or lying down. If directed, apply heat to the affected area. Use the heat source that your health care provider recommends, such as a moist heat pack or a heating pad. Place a towel between your skin and the heat source. Leave the heat on for 20-30 minutes. Remove the heat if your skin turns bright red. This is especially important if you are unable to feel pain, heat, or cold. You may have a greater risk of getting  burned. Activity Return to your normal activities as told by your health care provider. Ask your health care provider what activities are safe for you. Do exercises as told by your health care provider and physical therapist. General instructions Take over-the-counter and prescription medicines only as told by your health care provider. Sleep with a pillow between your knees. Do not use any products that contain nicotine or tobacco. These products include cigarettes, chewing tobacco, and vaping devices, such as e-cigarettes. These can delay healing. If you need help quitting, ask your health care provider. If you are overweight, work with your health care provider and a dietitian to set a weight-loss goal that is healthy and reasonable for you. Keep all follow-up visits. This is important. How is this prevented? Warm up and stretch before being active. Cool down and stretch after being active. Give your body time to rest between periods of activity. Use equipment that fits you. Be safe and responsible while being active to avoid falls. Maintain a healthy weight. Maintain physical fitness, including: Strength. Flexibility. Cardiovascular fitness. Endurance. Contact a health care provider if: Your symptoms do not improve. Your symptoms get worse. Summary Pes anserine bursitis is a condition that happens when the fluid-filled sac (bursa) at the inside of your knee gets swollen, irritated, and causes pain. Treatment for pes anserine bursitis may include resting your knee, icing the inside of your knee, sleeping with a pillow between your knees, taking medicine by mouth or by injection, and doing strengthening and stretching exercises (physical therapy). Follow instructions for managing pain, stiffness, and swelling. Take over-the-counter and prescription medicines only as told by your health care provider. This information is not intended to replace advice given to you by your health care  provider. Make sure you discuss any questions you have with your health care provider. Document Revised: 01/09/2021 Document Reviewed: 01/09/2021 Elsevier Patient Education  [place].     Pes Anserine Bursitis Rehab Ask your health care provider which exercises are safe for you. Do exercises exactly as told by your health care provider and adjust them as directed. It is normal to feel mild stretching, pulling, tightness, or discomfort as you do these exercises. Stop right away if you feel sudden pain or your pain gets worse. Do not begin these exercises until told by your health care provider. Stretching and range-of-motion exercises These exercises warm up your muscles and joints and improve the movement and flexibility of your knee. These exercises also help to relieve pain and stiffness. Hamstring stretch, doorway  Lie on your back in front of a doorway with your left / right leg resting against the wall and your other leg flat on the floor in the doorway. There should be a slight bend in your left / right knee. Straighten your left / right knee. You should feel a stretch behind your knee or thigh (hamstring). If you do not, scoot your buttocks closer to the door. Hold this position for __________ seconds. Repeat __________ times. Complete this exercise __________  times a day. Seated stretch This exercise is sometimes called hamstrings and adductors stretch. Sit on the floor with your legs stretched wide. Keep your knees straight during this exercise. Keeping your head and back in a straight line, bend at your waist to reach for your left foot (position A). You should feel a stretch in your right inner thigh (adductors). Hold for __________ seconds. Then slowly return to the upright position. Keeping your head and back in a straight line, bend at your waist to reach forward (position B). You should feel a stretch behind both of your thighs or knees (hamstrings). Hold for  __________ seconds. Then slowly return to the upright position. Keeping your head and back in a straight line, bend at your waist to reach for your right foot (position C). You should feel a stretch in your left inner thigh (adductors). Hold for __________ seconds. Then slowly return to the upright position. Repeat __________ times. Complete this exercise __________ times a day. Quadriceps, prone  Lie on your abdomen (prone position) on a firm surface, such as a bed or padded floor. Bend your left / right knee and hold your ankle. If you cannot reach your ankle or pant leg, loop a belt around your foot and grab the belt instead. Gently pull your heel toward your buttocks. Your knee should not slide out to the side. You should feel a stretch in the front of your thigh and knee (quadriceps). Hold this position for __________ seconds. Repeat __________ times. Complete this exercise __________ times a day. Strengthening exercises These exercises build strength and endurance in your knee and hip. Endurance is the ability to use your muscles for a long time, even after they get tired. Quadriceps terminal knee extension  Secure a long loop of rubber exercise band around a sturdy object like a table leg. Put the band behind your left / right knee. Step back from where the band is secured to put tension on the band. Slowly bend your left / right knee. Keep your left / right foot flat on the floor. Tighten the muscle in the front of your thigh (quadriceps) and push back against the band to straighten your knee until it is completely straight (terminal extension). Hold this position for __________ seconds. Return to having your left / right knee bent. Repeat __________ times. Complete this exercise __________ times a day. Straight leg raises, side-lying This exercise strengthens the muscles that rotate the leg at the hip and move it away from your body (hip abductors). Lie on your side with your left /  right leg in the top position. Lie so your head, shoulder, hip, and knee line up. You may bend your bottom knee to help you balance. Lift your top leg 4-6 inches (10-15 cm) while keeping your toes pointed straight ahead. Hold this position for __________ seconds. Slowly lower your leg to the starting position. Allow your muscles to relax completely after each repetition. Repeat __________ times. Complete this exercise __________ times a day. This information is not intended to replace advice given to you by your health care provider. Make sure you discuss any questions you have with your health care provider. Document Revised: 12/18/2020 Document Reviewed: 12/18/2020 Elsevier Patient Education  [place].

## 2022-01-22 ENCOUNTER — Ambulatory Visit (INDEPENDENT_AMBULATORY_CARE_PROVIDER_SITE_OTHER): Payer: BC Managed Care – PPO | Admitting: Family Medicine

## 2022-01-22 ENCOUNTER — Encounter: Payer: Self-pay | Admitting: Family Medicine

## 2022-01-22 VITALS — BP 107/69 | HR 95 | Temp 99.3°F | Ht 65.0 in | Wt 211.4 lb

## 2022-01-22 DIAGNOSIS — R051 Acute cough: Secondary | ICD-10-CM

## 2022-01-22 DIAGNOSIS — J0100 Acute maxillary sinusitis, unspecified: Secondary | ICD-10-CM | POA: Diagnosis not present

## 2022-01-22 MED ORDER — AMOXICILLIN-POT CLAVULANATE 875-125 MG PO TABS: 1.0000 | ORAL_TABLET | Freq: Two times a day (BID) | ORAL | 0 refills | Status: DC

## 2022-01-22 MED ORDER — BENZONATATE 100 MG PO CAPS: 100.0000 mg | ORAL_CAPSULE | Freq: Three times a day (TID) | ORAL | 0 refills | Status: DC | PRN

## 2022-01-22 NOTE — Progress Notes (Signed)
Acute Office Visit  Subjective:     Patient ID: [name], female    DOB: May 31, 1960, 61 y.o.   MRN: [id number]  Chief Complaint  Patient presents with   Cough    Patient in office for cough , metal taste in  mouth- chest pressure, possible fever but was never checked, fatigue, nausea x 5 days.    HPI This is a new problem. Presents today for an acute visit with complaint of cough, metal taste in mouth, chest pressure with coughing only, low energy, fever: t-max: did not check, nausea with coughing.  Symptoms have been present for 5 days Associated symptoms include: fever, chills, no shortness of breath, no chest pain, no exertional SOB, no vomiting, keeping fluids down and eating well.  Treatments tried include : OTC mucinex Treatment effective : not effective Sick contacts : works at [employer]. Around grandchildren.  Former smoker: quit 1994, denies asthma/copd.   Review of Systems  Constitutional:  Positive for chills (slept on sofa with coat on last week), fever (did not measure temperature at home.) and malaise/fatigue.  HENT:  Positive for congestion. Negative for ear pain and sinus pain.   Respiratory:  Positive for cough. Negative for sputum production, shortness of breath and wheezing.   Cardiovascular:  Negative for chest pain.  Gastrointestinal:  Positive for nausea (not constant.). Negative for abdominal pain and vomiting.        Objective:    BP 107/69   Pulse 95   Temp 99.3 F (37.4 C)   Ht '5\' 5"'$  (1.651 m)   Wt 211 lb 7 oz (95.9 kg)   SpO2 98%   BMI 35.19 kg/m    Physical Exam Vitals and nursing note reviewed.  Constitutional:      General: She is not in acute distress.    Appearance: Normal appearance. She is not toxic-appearing.  HENT:     Right Ear: Tympanic membrane normal.     Left Ear: Tympanic membrane normal.     Nose:     Right Turbinates: Swollen.     Left Turbinates: Swollen.     Right Sinus: Maxillary sinus tenderness present.      Left Sinus: Maxillary sinus tenderness present.     Mouth/Throat:     Pharynx: Uvula midline. No oropharyngeal exudate or posterior oropharyngeal erythema.  Cardiovascular:     Rate and Rhythm: Normal rate and regular rhythm.     Heart sounds: Normal heart sounds.  Pulmonary:     Effort: Pulmonary effort is normal.     Breath sounds: Normal breath sounds.  Skin:    General: Skin is warm and dry.  Neurological:     General: No focal deficit present.     Mental Status: She is alert. Mental status is at baseline.  Psychiatric:        Mood and Affect: Mood normal.        Behavior: Behavior normal.     No results found for any visits on 01/22/22.      Assessment & Plan:   Problem List Items Addressed This Visit     Acute non-recurrent maxillary sinusitis - Primary   Relevant Medications   amoxicillin-clavulanate (AUGMENTIN) 875-125 MG tablet   benzonatate (TESSALON) 100 MG capsule   Acute cough   Relevant Medications   benzonatate (TESSALON) 100 MG capsule    Meds ordered this encounter  Medications   amoxicillin-clavulanate (AUGMENTIN) 875-125 MG tablet    Sig: Take 1 tablet by mouth  2 (two) times daily.    Dispense:  14 tablet    Refill:  0    Order Specific Question:   Supervising Provider    Answer:   Leeanne Rio [[id number]]   benzonatate (TESSALON) 100 MG capsule    Sig: Take 1 capsule (100 mg total) by mouth 3 (three) times daily as needed for cough.    Dispense:  20 capsule    Refill:  0    Order Specific Question:   Supervising Provider    Answer:   Leeanne Rio [[id number]]  Maxillary sinus pain with palpation. Cough likely from post nasal drip. Lungs clear. Chest pressure with coughing only, denies chest pain/shortness of breath. Keeping fluids down, denies vomiting and abdominal pain.  Antibiotic as instructed. Benzonatate up to three times per day.  Sinus rinses. Supportive therapy to include adequate hydration.   Return if symptoms worsen or fail  to improve. Agrees with plan of care discussed today.   Chalmers Guest, FNP

## 2022-03-15 ENCOUNTER — Ambulatory Visit: Payer: BC Managed Care – PPO | Admitting: Obstetrics and Gynecology

## 2022-03-18 ENCOUNTER — Encounter: Payer: Self-pay | Admitting: *Deleted

## 2022-05-03 IMAGING — MG MM DIGITAL SCREENING BILAT W/ TOMO AND CAD
8 series · 8 of 24 positions shown · non-contrast
Comparison: Previous exam(s).

CLINICAL DATA: Screening.

EXAM:
DIGITAL SCREENING BILATERAL MAMMOGRAM WITH TOMOSYNTHESIS AND CAD
TECHNIQUE: Bilateral screening digital craniocaudal and mediolateral oblique
mammograms were obtained. Bilateral screening digital breast
tomosynthesis was performed. The images were evaluated with
computer-aided detection.

[L MLO synth-2D]
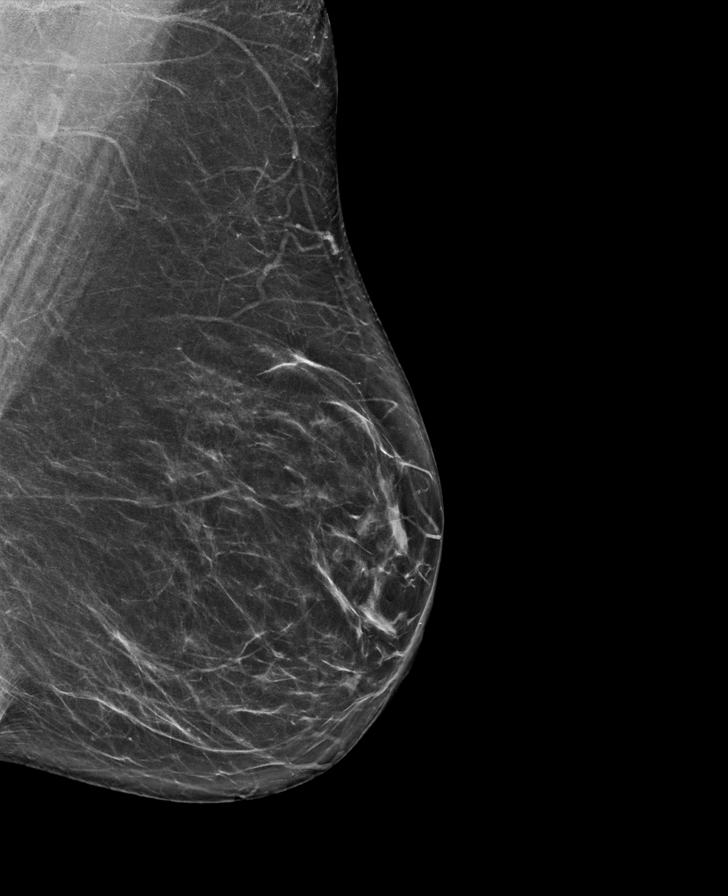

[R MLO synth-2D]
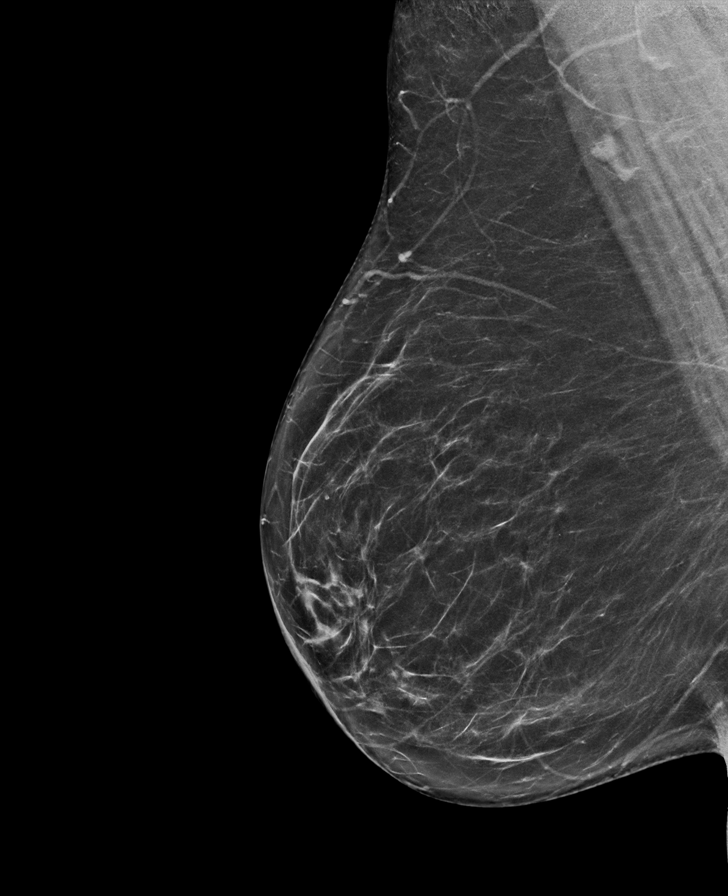

[R CC synth-2D]
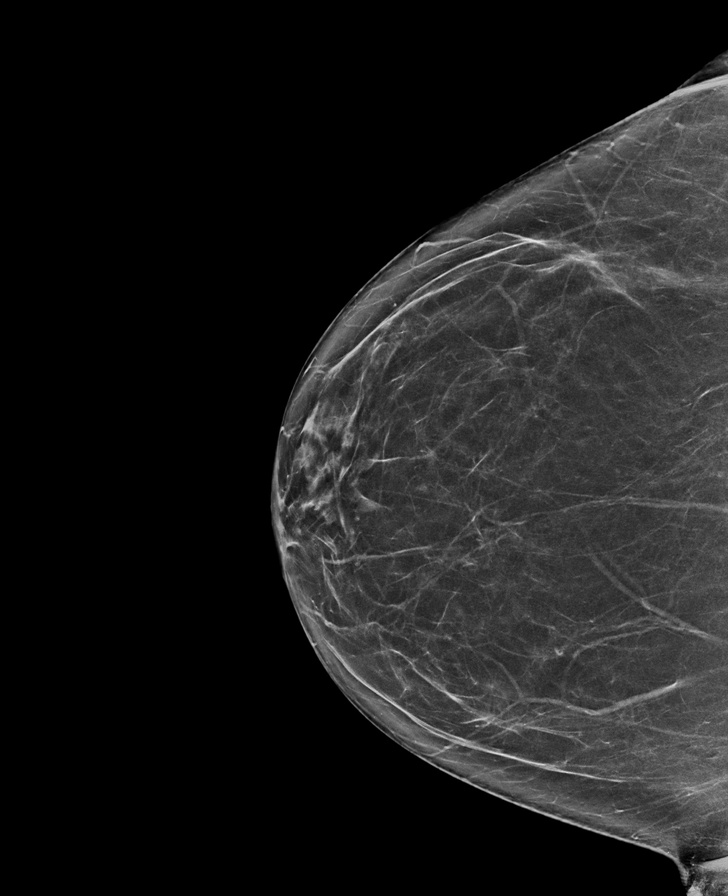

[L CC synth-2D]
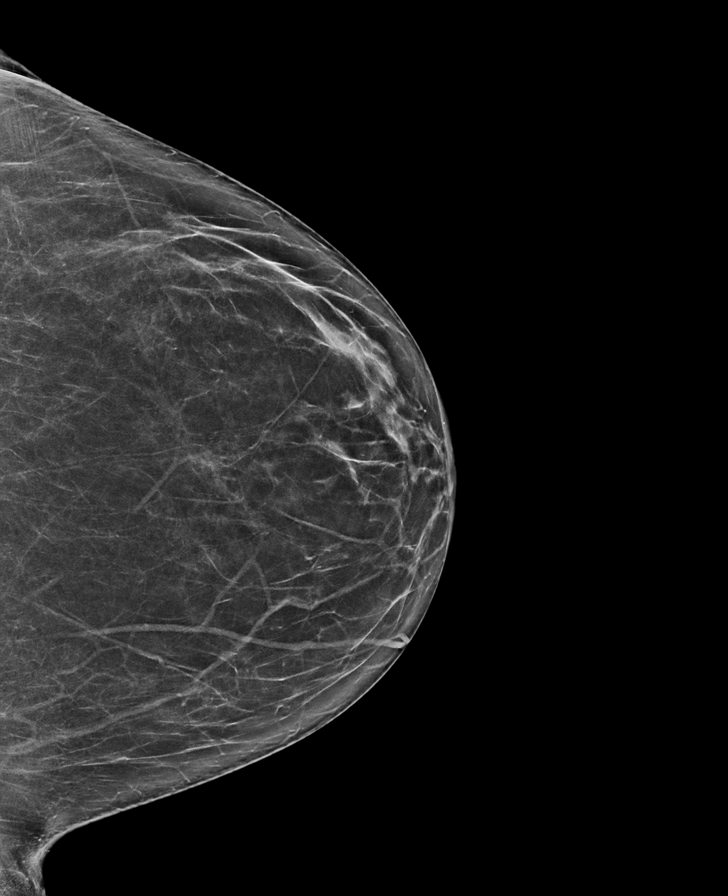

[R CC tomo · tomo slice 37/72.0]
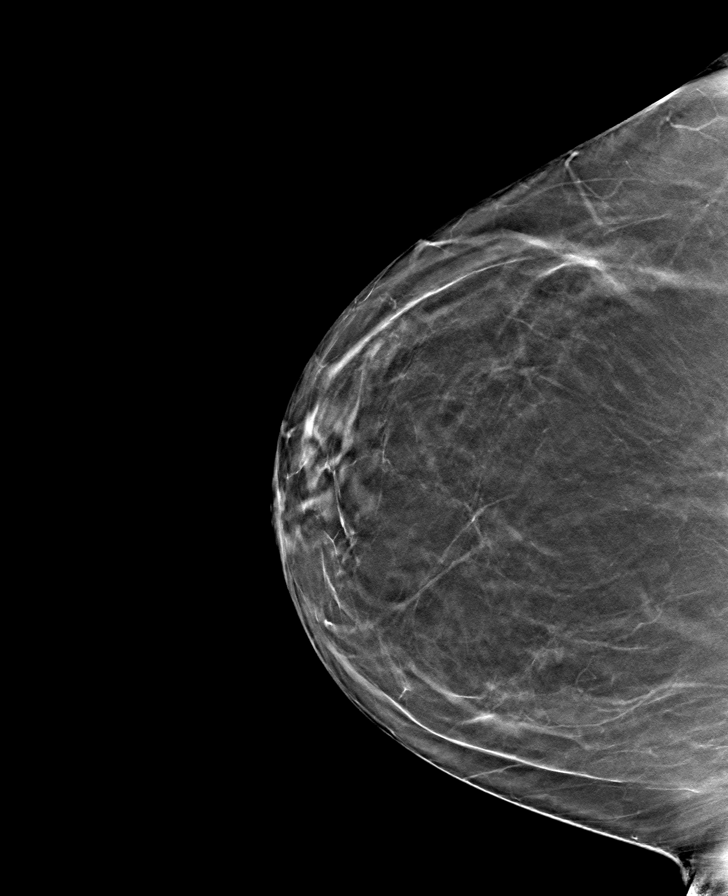

[L CC tomo · tomo slice 35/70.0]
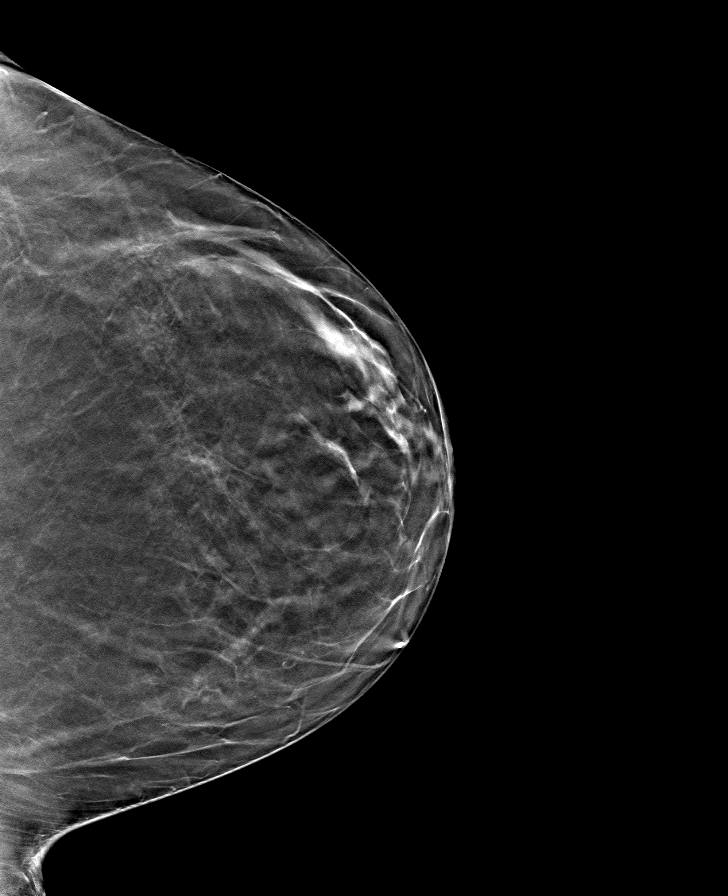

[R MLO tomo · tomo slice 39/76.0]
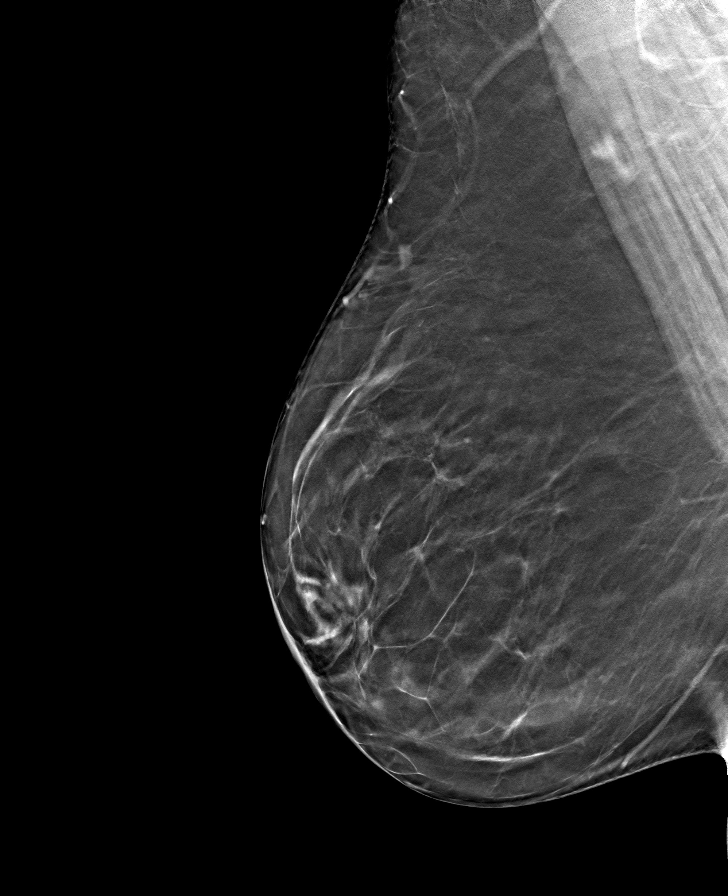

[L MLO tomo · tomo slice 39/76.0]
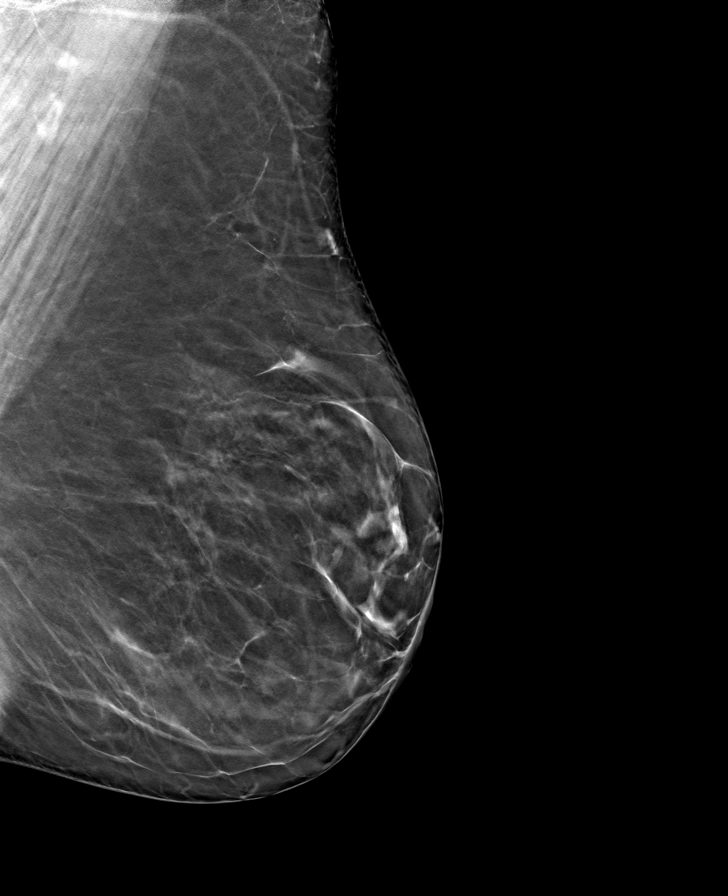

[8 of 24 positions shown; findings below may reference images not displayed]

ACR Breast Density Category b: There are scattered areas of
fibroglandular density.
FINDINGS: There are no findings suspicious for malignancy.
IMPRESSION: No mammographic evidence of malignancy. A result letter of this
screening mammogram will be mailed directly to the patient.

RECOMMENDATION:
Screening mammogram in one year. (Code:51-O-LD2)

BI-RADS CATEGORY  1: Negative.

## 2022-06-07 ENCOUNTER — Ambulatory Visit (INDEPENDENT_AMBULATORY_CARE_PROVIDER_SITE_OTHER): Payer: BC Managed Care – PPO | Admitting: Family Medicine

## 2022-06-07 ENCOUNTER — Encounter: Payer: Self-pay | Admitting: Family Medicine

## 2022-06-07 VITALS — BP 125/46 | HR 90

## 2022-06-07 DIAGNOSIS — N3000 Acute cystitis without hematuria: Secondary | ICD-10-CM | POA: Diagnosis not present

## 2022-06-07 DIAGNOSIS — R8290 Unspecified abnormal findings in urine: Secondary | ICD-10-CM | POA: Diagnosis not present

## 2022-06-07 LAB — POCT URINALYSIS DIP (CLINITEK)
Glucose, UA: NEGATIVE mg/dL
Nitrite, UA: NEGATIVE
POC PROTEIN,UA: 100 — AB
Spec Grav, UA: >=1.03 — AB (ref 1.010–1.025)
Urobilinogen, UA: 1 E.U./dL
pH, UA: 5.5 (ref 5.0–8.0)

## 2022-06-07 MED ORDER — NITROFURANTOIN MONOHYD MACRO 100 MG PO CAPS: 100.0000 mg | ORAL_CAPSULE | Freq: Two times a day (BID) | ORAL | 0 refills | Status: DC

## 2022-06-07 NOTE — Progress Notes (Signed)
   Acute Office Visit  Subjective:     Patient ID: [name], female    DOB: 09-14-1960, 62 y.o.   MRN: [id number]  Chief Complaint  Patient presents with   Urinary Tract Infection    Sxs x 2 days she reports urgency and pain, denies f/s/c/n/v/d abdominal,back or flank pain    HPI Patient is in today for 2 days of dysuria and frequency.  No back pain.  No blood in the urine.  No fevers chills or sweats.  She says she gets occasional UTIs it looks like the last one on file was about a year ago and was treated with Macrobid at that time.  ROS      Objective:    BP (!) 125/46   Pulse 90   SpO2 93%    Physical Exam Vitals and nursing note reviewed.  Constitutional:      Appearance: She is well-developed.  HENT:     Head: Normocephalic and atraumatic.  Cardiovascular:     Rate and Rhythm: Normal rate and regular rhythm.     Heart sounds: Normal heart sounds.  Pulmonary:     Effort: Pulmonary effort is normal.     Breath sounds: Normal breath sounds.  Abdominal:     General: Bowel sounds are normal.     Palpations: Abdomen is soft.     Tenderness: There is no abdominal tenderness.  Musculoskeletal:     Comments: No CVA tenderness  Skin:    General: Skin is warm and dry.  Neurological:     Mental Status: She is alert and oriented to person, place, and time.  Psychiatric:        Behavior: Behavior normal.     No results found for any visits on 06/07/22.      Assessment & Plan:   Problem List Items Addressed This Visit   None Visit Diagnoses     Acute cystitis without hematuria    -  Primary   Relevant Medications   nitrofurantoin, macrocrystal-monohydrate, (MACROBID) 100 MG capsule   Abnormal urine       Relevant Orders   Urinalysis, Routine w reflex microscopic      Call if not better in 1 week.  Symptomatic care.  Drink plenty of water and stay hydrated.    I did ask her to return in about a week after she completes her antibiotics just to make  sure that the protein and blood have cleared from her urine.   Meds ordered this encounter  Medications   nitrofurantoin, macrocrystal-monohydrate, (MACROBID) 100 MG capsule    Sig: Take 1 capsule (100 mg total) by mouth 2 (two) times daily.    Dispense:  10 capsule    Refill:  0    No follow-ups on file.  Nani Gasser, MD

## 2022-06-07 NOTE — Addendum Note (Signed)
Addended by: Deno Etienne on: 06/07/2022 04:19 PM   Modules accepted: Orders

## 2022-06-14 ENCOUNTER — Ambulatory Visit (INDEPENDENT_AMBULATORY_CARE_PROVIDER_SITE_OTHER): Payer: BC Managed Care – PPO | Admitting: Obstetrics and Gynecology

## 2022-06-14 ENCOUNTER — Encounter: Payer: Self-pay | Admitting: Obstetrics and Gynecology

## 2022-06-14 VITALS — BP 117/67 | HR 78 | Ht 63.5 in | Wt 202.6 lb

## 2022-06-14 DIAGNOSIS — K5900 Constipation, unspecified: Secondary | ICD-10-CM | POA: Diagnosis not present

## 2022-06-14 DIAGNOSIS — N3941 Urge incontinence: Secondary | ICD-10-CM | POA: Diagnosis not present

## 2022-06-14 DIAGNOSIS — N3281 Overactive bladder: Secondary | ICD-10-CM | POA: Diagnosis not present

## 2022-06-14 DIAGNOSIS — R350 Frequency of micturition: Secondary | ICD-10-CM

## 2022-06-14 LAB — POCT URINALYSIS DIPSTICK
Bilirubin, UA: NEGATIVE
Glucose, UA: NEGATIVE
Ketones, UA: NEGATIVE
Leukocytes, UA: NEGATIVE
Nitrite, UA: NEGATIVE
Protein, UA: NEGATIVE
Spec Grav, UA: 1.025 (ref 1.010–1.025)
Urobilinogen, UA: 0.2 E.U./dL
pH, UA: 7 (ref 5.0–8.0)

## 2022-06-14 MED ORDER — POLYETHYLENE GLYCOL 3350 17 GM/SCOOP PO POWD: 17.0000 g | Freq: Every day | ORAL | 5 refills | Status: DC

## 2022-06-14 MED ORDER — OXYBUTYNIN CHLORIDE ER 10 MG PO TB24: 10.0000 mg | ORAL_TABLET | Freq: Every day | ORAL | 2 refills | Status: DC

## 2022-06-14 NOTE — Progress Notes (Signed)
[facility] Urogynecology New Patient Evaluation and Consultation  Referring Provider: Everrett Coombe, DO PCP: Everrett Coombe, DO Date of Service: 06/14/2022  SUBJECTIVE Chief Complaint: New Patient (Initial Visit) [name] is a 62 y.o. female is here for urinary incontinence and frequency.)  History of Present Illness: [name] is a 62 y.o. White or Caucasian female seen in consultation at the request of Dr. Ashley Royalty for evaluation of urinary incontinence.    Review of records significant for: Has never done pelvic floor PT, never taken bladder medications  Urinary Symptoms: Leaks urine with with a full bladder and continuously Leaks 2 time(s) per days.  Pad use: 4 pads per day.   She is bothered by her UI symptoms.  Day time voids 7-10.  Nocturia: 3 times per night to void. Voiding dysfunction: she does not empty her bladder well.  does not use a catheter to empty bladder.  Drinks: Protein shake, coffee (24oz) and water per day  UTIs: 4 UTI's in the last year.   Reports history of blood in urine  Pelvic Organ Prolapse Symptoms:                  She Denies a feeling of a bulge the vaginal area.   Bowel Symptom: Bowel movements: 1 time(s) per day Stool consistency: soft  Straining: yes.  Splinting: no.  Incomplete evacuation: no.  She Denies accidental bowel leakage / fecal incontinence Bowel regimen: diet Last colonoscopy: Date 2018, Results small polyp   Sexual Function Sexually active: yes.  Sexual orientation: Straight Pain with sex: No  Pelvic Pain Denies pelvic pain    Past Medical History:  Past Medical History:  Diagnosis Date   Anxiety    Chills    Cough    Obesity    Unintentional weight loss    Vomiting    Weakness      Past Surgical History:   Past Surgical History:  Procedure Laterality Date   ABDOMINAL HYSTERECTOMY  2006   FIBROIDS   CESAREAN SECTION  1992, 1994   LAPAROSCOPIC GASTRIC BANDING  12/02/2006     Past  OB/GYN History: G2 P2 Vaginal deliveries: 2,  Forceps/ Vacuum deliveries: 0, Cesarean section: 0 Menopausal: Yes Last pap smear was 2008   Medications: She has a current medication list which includes the following prescription(s): oxybutynin, polyethylene glycol powder, and venlafaxine.   Allergies: Patient has No Known Allergies.   Social History:  Social History   Tobacco Use   Smoking status: Former    Types: Cigarettes    Quit date: 04/02/1993    Years since quitting: 29.2   Smokeless tobacco: Never  Vaping Use   Vaping Use: Never used  Substance Use Topics   Alcohol use: Yes    Comment: Socially   Drug use: No    Relationship status: married She lives with husband and 2 grandchildren that she is raising.   She is employed at [employer]. Regular exercise: No History of abuse: No  Family History:   Family History  Problem Relation Age of Onset   Cancer Cousin        breast - mother's cousin   Diabetes Mother    Hypertension Mother    Hypertension Father    Stomach cancer Paternal Grandmother    Colon cancer Neg Hx    Esophageal cancer Neg Hx    Rectal cancer Neg Hx      Review of Systems: Review of Systems  Constitutional:  Positive for  malaise/fatigue. Negative for fever.  Respiratory:  Negative for cough, shortness of breath and wheezing.   Cardiovascular:  Negative for chest pain, palpitations and leg swelling.  Genitourinary:  Positive for dysuria.  Skin:  Negative for rash.  Neurological:  Positive for dizziness. Negative for headaches.  Endo/Heme/Allergies:        + Hot Flashes  Psychiatric/Behavioral:  Positive for depression. The patient is nervous/anxious.      OBJECTIVE Physical Exam: Vitals:   06/14/22 0919  BP: 117/67  Pulse: 78  Weight: 202 lb 9.6 oz (91.9 kg)  Height: 5' 3.5" (1.613 m)    Physical Exam Constitutional:      Appearance: Normal appearance.  Genitourinary:    Comments: +Vulvar irritation  Musculoskeletal:         General: Normal range of motion.  Skin:    General: Skin is warm and dry.  Neurological:     Mental Status: She is alert and oriented to person, place, and time.  Psychiatric:        Mood and Affect: Mood normal.        Behavior: Behavior normal.        Thought Content: Thought content normal.        Judgment: Judgment normal.      GU / Detailed Urogynecologic Evaluation:  Pelvic Exam: Normal external female genitalia; Bartholin's and Skene's glands normal in appearance; urethral meatus normal in appearance, no urethral masses or discharge.   CST: negative  s/p hysterectomy: Speculum exam reveals normal vaginal mucosa without  atrophy and normal vaginal cuff.  Adnexa normal adnexa.    Pelvic floor strength II/V, puborectalis   Pelvic floor musculature: Right levator non-tender, Right obturator non-tender, Left levator non-tender, Left obturator non-tender  POP-Q:   POP-Q  -2.5                                            Aa   -2.5                                           Ba  -7.5                                              C   2                                            Gh  2.5                                            Pb  8.5                                            tvl   -3  Ap  -3                                            Bp                                                 D      Rectal Exam:  Normal sphincter tone, no distal rectocele, enterocoele not present, no rectal masses, no sign of dyssynergia when asking the patient to bear down.  Post-Void Residual (PVR) by Bladder Scan: In order to evaluate bladder emptying, we discussed obtaining a postvoid residual and she agreed to this procedure.  Procedure: The ultrasound unit was placed on the patient's abdomen in the suprapubic region after the patient had voided. A PVR of 25 ml was obtained by bladder scan.  Laboratory Results: POC Urine: Negative for all  components today  ASSESSMENT AND PLAN [name] is a 62 y.o. with:  1. Overactive bladder   2. Urge incontinence of urine   3. Urinary frequency   4. Constipation, unspecified constipation type    We discussed the symptoms of overactive bladder (OAB), which include urinary urgency, urinary frequency, nocturia, with or without urge incontinence.  While we do not know the exact etiology of OAB, several treatment options exist. We discussed management including behavioral therapy (decreasing bladder irritants, urge suppression strategies, timed voids, bladder retraining), physical therapy, medication; for refractory cases posterior tibial nerve stimulation, sacral neuromodulation, and intravesical botulinum toxin injection. For anticholinergic medications, we discussed the potential side effects of anticholinergics including dry eyes, dry mouth, constipation, cognitive impairment and urinary retention. Patient reports she will work on decreasing bladder irritatnts. We discussed physical therapy exercises as well for pelvic floor and practiced a kegel in office.  Will start patient on Oxybutynin XL 10mg  daily.  POC urine negative for signs of infection today.  We discussed increasing dietary fiber and taking miralax for constipation. She reports she will start Miralax daily for constipation.   Patient to follow up in 6 weeks or sooner if needed.

## 2022-06-14 NOTE — Patient Instructions (Addendum)
Contipation: take Miralax daily for constipation   Start the Oxybutynin for overactive bladder  Reduce your coffee intake (Goal of 1 cup per day), stop drinking about 3 hours before bedtime.  Use coconut oil or vitamin e cream on the outside of the vagina related to the pad use.

## 2022-06-19 ENCOUNTER — Other Ambulatory Visit: Payer: Self-pay

## 2022-06-19 MED ORDER — OXYBUTYNIN CHLORIDE ER 10 MG PO TB24: 10.0000 mg | ORAL_TABLET | Freq: Every day | ORAL | 2 refills | Status: DC

## 2022-06-19 NOTE — Progress Notes (Signed)
Medication location changed to Karin Golden is [employer]

## 2022-07-22 DIAGNOSIS — R072 Precordial pain: Secondary | ICD-10-CM | POA: Diagnosis not present

## 2022-07-22 DIAGNOSIS — R0789 Other chest pain: Secondary | ICD-10-CM | POA: Diagnosis not present

## 2022-07-22 DIAGNOSIS — I10 Essential (primary) hypertension: Secondary | ICD-10-CM | POA: Diagnosis not present

## 2022-07-22 DIAGNOSIS — R1111 Vomiting without nausea: Secondary | ICD-10-CM | POA: Diagnosis not present

## 2022-07-22 DIAGNOSIS — Z79899 Other long term (current) drug therapy: Secondary | ICD-10-CM | POA: Diagnosis not present

## 2022-07-22 DIAGNOSIS — R9431 Abnormal electrocardiogram [ECG] [EKG]: Secondary | ICD-10-CM | POA: Diagnosis not present

## 2022-07-22 DIAGNOSIS — R079 Chest pain, unspecified: Secondary | ICD-10-CM | POA: Diagnosis not present

## 2022-07-22 DIAGNOSIS — R110 Nausea: Secondary | ICD-10-CM | POA: Diagnosis not present

## 2022-07-22 DIAGNOSIS — R61 Generalized hyperhidrosis: Secondary | ICD-10-CM | POA: Diagnosis not present

## 2022-07-26 ENCOUNTER — Ambulatory Visit: Payer: BC Managed Care – PPO | Admitting: Obstetrics and Gynecology

## 2022-08-02 ENCOUNTER — Ambulatory Visit: Payer: BC Managed Care – PPO | Admitting: Obstetrics and Gynecology

## 2022-08-23 ENCOUNTER — Ambulatory Visit (INDEPENDENT_AMBULATORY_CARE_PROVIDER_SITE_OTHER): Payer: BC Managed Care – PPO | Admitting: Family Medicine

## 2022-08-23 VITALS — BP 123/68 | HR 83 | Ht 64.0 in | Wt 218.8 lb

## 2022-08-23 DIAGNOSIS — K2100 Gastro-esophageal reflux disease with esophagitis, without bleeding: Secondary | ICD-10-CM

## 2022-08-23 DIAGNOSIS — K224 Dyskinesia of esophagus: Secondary | ICD-10-CM | POA: Diagnosis not present

## 2022-08-23 DIAGNOSIS — E669 Obesity, unspecified: Secondary | ICD-10-CM | POA: Insufficient documentation

## 2022-08-23 MED ORDER — QSYMIA 7.5-46 MG PO CP24: 1.0000 | ORAL_CAPSULE | Freq: Every day | ORAL | 2 refills | Status: DC

## 2022-08-23 MED ORDER — QSYMIA 3.75-23 MG PO CP24: ORAL_CAPSULE | ORAL | 0 refills | Status: DC

## 2022-08-23 NOTE — Assessment & Plan Note (Signed)
Encouraged dietary changes.  Adding qsymia with titration based on response and tolerance.  F/u in 3 months.

## 2022-08-23 NOTE — Progress Notes (Signed)
[name] - 62 y.o. female MRN [id number]  Date of birth: 1960/12/25  Subjective Chief Complaint  Patient presents with   ER follow up     Patient states was seen for palpitations but was found to have esophageal spasms. Wanting to know if she should keep the cardiology appt or be referred to GI?    HPI [name] is a 62 y.o. female here today for follow up of recent ED visit.  Called EMS due to chest pain and palpitations.  She had had some nausea and vomiting as well.  Nausea had been fairly persistent over the past week prior to having chest pain.  She was given asa and ntg by EMS and transported to ED.  Symptoms improved upon arrival to ED.  Had serial EKG's and troponins which were all normal.  She had just returned from a family event in [place] and reports eating a lot and felt sick after this.  Lipase normal in the ED.  EDP felt that this was more esophageal spasm given r/o for ACS and improvement with NTG.  Recommended that she start OTC omeprazole.  She has not had any recurrent of symptoms since starting this.    She has tried to work on diet recently and has struggled with her weight for quite some time.  She would like to try medication to help with weight loss.  Her daughter has done really well with Qsymia and she is interested in trying this.    ROS:  A comprehensive ROS was completed and negative except as noted per HPI  No Known Allergies  Past Medical History:  Diagnosis Date   Anxiety    Chills    Cough    Obesity    Unintentional weight loss    Vomiting    Weakness     Past Surgical History:  Procedure Laterality Date   ABDOMINAL HYSTERECTOMY  2006   FIBROIDS   CESAREAN SECTION  1992, 1994   LAPAROSCOPIC GASTRIC BANDING  12/02/2006    Social History   Socioeconomic History   Marital status: Married    Spouse name: Not on file   Number of children: Not on file   Years of education: Not on file   Highest education level: Some college, no degree   Occupational History   Not on file  Tobacco Use   Smoking status: Former    Current packs/day: 0.00    Types: Cigarettes    Quit date: 04/02/1993    Years since quitting: 29.4   Smokeless tobacco: Never  Vaping Use   Vaping status: Never Used  Substance and Sexual Activity   Alcohol use: Yes    Comment: Socially   Drug use: No   Sexual activity: Yes    Birth control/protection: None    Comment: intercourse age 11,more than 5 sexual partners,des neg  Other Topics Concern   Not on file  Social History Narrative   Not on file   Social Determinants of Health   Financial Resource Strain: Low Risk  (08/23/2022)   Overall Financial Resource Strain (CARDIA)    Difficulty of Paying Living Expenses: Not very hard  Food Insecurity: No Food Insecurity (08/23/2022)   Hunger Vital Sign    Worried About Running Out of Food in the Last Year: Never true    Ran Out of Food in the Last Year: Never true  Transportation Needs: No Transportation Needs (08/23/2022)   PRAPARE - Transportation    Lack of  Transportation (Medical): No    Lack of Transportation (Non-Medical): No  Physical Activity: Unknown (08/23/2022)   Exercise Vital Sign    Days of Exercise per Week: 0 days    Minutes of Exercise per Session: Not on file  Stress: Stress Concern Present (08/23/2022)   [employer] of Occupational Health - Occupational Stress Questionnaire    Feeling of Stress : To some extent  Social Connections: Moderately Isolated (08/23/2022)   Social Connection and Isolation Panel [NHANES]    Frequency of Communication with Friends and Family: More than three times a week    Frequency of Social Gatherings with Friends and Family: Twice a week    Attends Religious Services: Never    Database administrator or Organizations: No    Attends Engineer, structural: Not on file    Marital Status: Married    Family History  Problem Relation Age of Onset   Cancer Cousin        breast - mother's cousin    Diabetes Mother    Hypertension Mother    Hypertension Father    Stomach cancer Paternal Grandmother    Colon cancer Neg Hx    Esophageal cancer Neg Hx    Rectal cancer Neg Hx     Health Maintenance  Topic Date Due   Hepatitis C Screening  12/18/2022 (Originally 03/30/1979)   HIV Screening  12/18/2022 (Originally 03/29/1976)   COVID-19 Vaccine (3 - 2023-24 season) 06/23/2023 (Originally 09/28/2021)   Zoster Vaccines- Shingrix (1 of 2) 09/07/2023 (Originally 03/30/2011)   INFLUENZA VACCINE  08/29/2022   MAMMOGRAM  11/08/2023   DTaP/Tdap/Td (2 - Td or Tdap) 06/11/2026   Colonoscopy  08/13/2026   HPV VACCINES  Aged Out   PAP SMEAR-Modifier  Discontinued     ----------------------------------------------------------------------------------------------------------------------------------------------------------------------------------------------------------------- Physical Exam BP 123/68   Pulse 83   Ht 5\' 4"  (1.626 m)   Wt 218 lb 12 oz (99.2 kg)   SpO2 98%   BMI 37.55 kg/m   Physical Exam Constitutional:      Appearance: Normal appearance.  HENT:     Head: Normocephalic and atraumatic.  Cardiovascular:     Rate and Rhythm: Normal rate and regular rhythm.  Pulmonary:     Effort: Pulmonary effort is normal.     Breath sounds: Normal breath sounds.  Neurological:     Mental Status: She is alert.  Psychiatric:        Mood and Affect: Mood normal.        Behavior: Behavior normal.     ------------------------------------------------------------------------------------------------------------------------------------------------------------------------------------------------------------------- Assessment and Plan  Obesity (BMI 35.0-39.9 without comorbidity) Encouraged dietary changes.  Adding qsymia with titration based on response and tolerance.  F/u in 3 months.   Esophageal spasm Recent episode of chest pain determined to be esophageal spasm.  Continue omeprazole.   Discussed avoidance of foods that may worsen reflux, avoid overeating.  Referral placed to GI.    Meds ordered this encounter  Medications   Phentermine-Topiramate (QSYMIA) 3.75-23 MG CP24    Sig: Take daily x14 days then increase to next strength.    Dispense:  14 capsule    Refill:  0   Phentermine-Topiramate (QSYMIA) 7.5-46 MG CP24    Sig: Take 1 capsule by mouth daily.    Dispense:  30 capsule    Refill:  2    Return in about 3 months (around 11/23/2022) for F/u Weight.    This visit occurred during the SARS-CoV-2 public health emergency.  Safety protocols were  in place, including screening questions prior to the visit, additional usage of staff PPE, and extensive cleaning of exam room while observing appropriate contact time as indicated for disinfecting solutions.

## 2022-08-23 NOTE — Assessment & Plan Note (Signed)
Recent episode of chest pain determined to be esophageal spasm.  Continue omeprazole.  Discussed avoidance of foods that may worsen reflux, avoid overeating.  Referral placed to GI.

## 2022-08-30 ENCOUNTER — Encounter: Payer: Self-pay | Admitting: Nurse Practitioner

## 2022-08-30 ENCOUNTER — Telehealth: Payer: Self-pay | Admitting: Family Medicine

## 2022-08-30 NOTE — Telephone Encounter (Signed)
Patient states that she needs a PA for Qsymia. Please Advise.

## 2022-09-05 ENCOUNTER — Telehealth: Payer: Self-pay

## 2022-09-05 NOTE — Telephone Encounter (Addendum)
Initiated Prior authorization OZH:[id number] 3.75-23MG  er capsules Via: Covermymeds Case/Key:[id number] Status: approved as of 09/05/22 Reason:Authorization Expiration Date: 03/08/2023 Notified Pt via: Mychart

## 2022-09-20 ENCOUNTER — Other Ambulatory Visit: Payer: Self-pay | Admitting: Obstetrics & Gynecology

## 2022-09-20 DIAGNOSIS — Z8659 Personal history of other mental and behavioral disorders: Secondary | ICD-10-CM

## 2022-10-04 ENCOUNTER — Telehealth: Payer: Self-pay | Admitting: *Deleted

## 2022-10-04 NOTE — Telephone Encounter (Signed)
Left patient a message to call and schedule annual. 

## 2022-10-08 ENCOUNTER — Telehealth: Payer: Self-pay | Admitting: Family Medicine

## 2022-10-08 DIAGNOSIS — Z8659 Personal history of other mental and behavioral disorders: Secondary | ICD-10-CM

## 2022-10-08 NOTE — Telephone Encounter (Signed)
Pt called. She is requesting a refill on  Effexor.

## 2022-10-10 MED ORDER — VENLAFAXINE HCL 75 MG PO TABS
75.0000 mg | ORAL_TABLET | Freq: Two times a day (BID) | ORAL | 1 refills | Status: DC
Start: 2022-10-10 — End: 2023-04-16

## 2022-10-16 ENCOUNTER — Telehealth: Payer: Self-pay | Admitting: Family Medicine

## 2022-10-16 NOTE — Telephone Encounter (Signed)
Patient called she is requesting a PA on QSYMIA 3.75 mg  Pharmacy [facility] [address]  [place] [address] Phone number is [phone]

## 2022-10-23 ENCOUNTER — Telehealth: Payer: Self-pay | Admitting: Family Medicine

## 2022-10-23 NOTE — Telephone Encounter (Signed)
Patient requests update/needs prior authorization for ;  Phentermine-Topiramate [facility]) 7.5-46 MG CP24 [[id number]]   Patient is also requesting a refill of this medication to be sent to  [id number] - [place], [place] - [address]

## 2022-10-25 NOTE — Telephone Encounter (Signed)
Please advise pt. PA has been initiated. Response takes up to 72 business hours. Thanks

## 2022-11-22 ENCOUNTER — Encounter: Payer: Self-pay | Admitting: Nurse Practitioner

## 2022-11-22 ENCOUNTER — Ambulatory Visit (INDEPENDENT_AMBULATORY_CARE_PROVIDER_SITE_OTHER): Payer: BC Managed Care – PPO | Admitting: Nurse Practitioner

## 2022-11-22 VITALS — BP 124/80 | HR 86 | Ht 64.0 in | Wt 224.0 lb

## 2022-11-22 DIAGNOSIS — R079 Chest pain, unspecified: Secondary | ICD-10-CM

## 2022-11-22 NOTE — Progress Notes (Unsigned)
11/24/2022 [name] [id number] 08-06-1960   CHIEF COMPLAINT: Possible esophageal spasms   HISTORY OF PRESENT ILLNESS: [name] is a 62 year old female with past medical history of anxiety, obesity, uterine fibroids status post hysterectomy 2006. Past lab band surgery. She presents to our office today as referred by Dr. Milford Cage for further evaluation regarding possible esophageal spasms. She was working at [employer] as a Conservation officer, nature and she developed abrupt onset mid chest pain described as a squeezing grabbing pain on 07/22/2022. EMS was called and she was transported to the ED. Her chest pain lasted for approximately 20 minutes then abated. She had sweats and chills prior to the onset of chest pain. EKG was unremarkable, no evidence of acute ischemia and troponin levels were normal. Chest xray was negative. Patient stated It was thought she might have had an esophageal spasm and further GI evaluation was recommended. She endorsed having prior acid reflux symptoms and was prescribed Famotidine every day which she took for one week. She was also referred to cardiology to rule out cardiac etiology for her chest pain but she to did not schedule an appointment as she wasn't concerned about having any heart issues. Since then, she denies having any further episodes of chest pain. No GERD symptoms. Past smoker. Infrequent NSAID use. Her stress level is notably elevated as she and her husband are raising their grandson and granddaughter ages 26 and 64. No bloody or black stools. Colonoscopy 07/2016 identified one polyp removed from the colon, path report showed benign lymphoid aggregate. S/P lab band surgery 2008, subsequently had some of the fluid removed from the band due to N/V.   Labs 07/22/2022:  WBC 8. Hg 14.1. HCT 45. PLT 190. BUN 18. Cr 0.70. T. Bili 0.99. AST 18. ALT 13. Lipase 25. Troponin 10.       Latest Ref Rng & Units 12/14/2020   12:00 AM 03/24/2019   12:20 PM 10/20/2017    3:20 PM   CBC  WBC 3.8 - 10.8 Thousand/uL 8.8  13.4  13.2   Hemoglobin 11.7 - 15.5 g/dL 40.9  81.1  91.4   Hematocrit 35.0 - 45.0 % 43.9  43.8  43.3   Platelets 140 - 400 Thousand/uL 289  312  209        Latest Ref Rng & Units 12/14/2020   12:00 AM 03/24/2019   12:20 PM 10/20/2017    3:20 PM  CMP  Glucose 65 - 99 mg/dL 80  90  84   BUN 7 - 25 mg/dL 14  12  12    Creatinine 0.50 - 1.03 mg/dL 7.82  9.56  2.13   Sodium 135 - 146 mmol/L 141  140  139   Potassium 3.5 - 5.3 mmol/L 4.5  4.7  4.2   Chloride 98 - 110 mmol/L 105  103  104   CO2 20 - 32 mmol/L 29  26  26    Calcium 8.6 - 10.4 mg/dL 9.3  9.9  9.1   Total Protein 6.1 - 8.1 g/dL 6.6  7.4  6.5   Total Bilirubin 0.2 - 1.2 mg/dL 0.7  0.8  0.7   AST 10 - 35 U/L 14  19  15    ALT 6 - 29 U/L 11  16  12      Colonoscopy 08/12/2016: - One diminutive polyp in the ascending colon, removed with a cold biopsy forceps. Resected and retrieved.  - The examination was otherwise normal on direct and retroflexion views. -  10 year recall colonoscopy  urgical [P], ascending, polyp - BENIGN POLYPOID COLONIC MUCOSA WITH SMALL BENIGN LYMPHOID AGGREGATE.  Past Medical History:  Diagnosis Date   Anxiety    Chills    Cough    Obesity    Unintentional weight loss    Vomiting    Weakness    Past Surgical History:  Procedure Laterality Date   ABDOMINAL HYSTERECTOMY  2006   FIBROIDS   CESAREAN SECTION  1992, 1994   LAPAROSCOPIC GASTRIC BANDING  12/02/2006  C section x 2. Nausea during C section.   Social History: She is married. She quit smoking cigarettes 30 years. Rare alcohol intake. No drug use.   Family History: Paternal grandmother had stomach cancer. Great maternal with grandmother heart disease. Mother with diabetes. No known family history of esophageal or colon cancer.   No Known Allergies   Outpatient Encounter Medications as of 11/22/2022  Medication Sig   venlafaxine (EFFEXOR) 75 MG tablet Take 1 tablet (75 mg total) by mouth 2 (two)  times daily.   oxybutynin (DITROPAN-XL) 10 MG 24 hr tablet Take 1 tablet (10 mg total) by mouth daily.   Phentermine-Topiramate (QSYMIA) 3.75-23 MG CP24 Take daily x14 days then increase to next strength.   Phentermine-Topiramate (QSYMIA) 7.5-46 MG CP24 Take 1 capsule by mouth daily.   No facility-administered encounter medications on file as of 11/22/2022.   REVIEW OF SYSTEMS:  Gen: Denies fever, sweats or chills. No weight loss.  CV: Denies chest pain, palpitations or edema. Resp: Denies cough, shortness of breath of hemoptysis.  GI: See HPI. GU: Denies urinary burning, blood in urine, increased urinary frequency or incontinence. MS: Denies joint pain, muscles aches or weakness. Derm: Denies rash, itchiness, skin lesions or unhealing ulcers. Psych:+ Anxiety. Heme: Denies bruising, easy bleeding. Neuro:  Denies headaches, dizziness or paresthesias. Endo:  Denies any problems with DM, thyroid or adrenal function.  PHYSICAL EXAM: BP 124/80   Pulse 86   Ht 5\' 4"  (1.626 m)   Wt 224 lb (101.6 kg)   BMI 38.45 kg/m  General:  62 year old female briefly tearful in no acute distress. Head: Normocephalic and atraumatic. Eyes:  Sclerae non-icteric, conjunctive pink. Ears: Normal auditory acuity. Mouth: Dentition intact. No ulcers or lesions.  Neck: Supple, no lymphadenopathy or thyromegaly.  Lungs: Clear bilaterally to auscultation without wheezes, crackles or rhonchi. Heart: Regular rate and rhythm. No murmur, rub or gallop appreciated.  Abdomen: Soft, nontender, nondistended. Lap band palpable to the medial right upper abdomen. No hepatosplenomegaly. Normoactive bowel sounds x 4 quadrants.  Rectal: Deferred.  Musculoskeletal: Symmetrical with no gross deformities. Skin: Warm and dry. No rash or lesions on visible extremities. Extremities: No edema. Neurological: Alert oriented x 4, no focal deficits.  Psychological:  Alert and cooperative. Normal mood and affect.  ASSESSMENT AND  PLAN:  62 year old female with one episode of sweats with mid chest pain which lasted about 30 minutes then abated without recurrence. Work up in the ED was negative for acute coronary syndrome. Normal LFTs and lipase level. Elevated stress level. Etiology for single episode of mid sternal chest pain remains unknown: cardiac vs biliary vs esophageal  -Cardiology consult recommended, referral entered. Patient agreed to schedule cardiology consult. -Barium swallow study to assess the esophagus, assess for evidence of GERD and esophageal spasms -Patient instructed to go to the ED if chest pain recurs  -Eventual RUQ sono to evaluate the gallbladder and CBD if the above evaluation unrevealing  -Avoid fatty  foods -Ok to take Famotidine 20mg  every day if needed  -Follow up with PCP for anxiety/elevated stress management   History of GERD, no current symptoms. Not taking acid reducing medication.  -See plan above   Colon caner screening  -Next screening colonoscopy due 07/2026    CC:  Everrett Coombe, DO

## 2022-11-22 NOTE — Patient Instructions (Signed)
You will be contacted by [facility] Scheduling in the next 2 days to arrange a barium swallow.  The number on your caller ID will be [phone], please answer when they call.  If you have not heard from them in 2 days please call [phone] to schedule.    Continue to take Famotidine  You have been referred to cardiology - they will call you to schedule a consultation.  Go to the ED if you have further chest pain  _______________________________________________________  If your blood pressure at your visit was 140/90 or greater, please contact your primary care physician to follow up on this.  _______________________________________________________  If you are age 81 or older, your body mass index should be between 23-30. Your Body mass index is 38.45 kg/m. If this is out of the aforementioned range listed, please consider follow up with your Primary Care Provider.  If you are age 50 or younger, your body mass index should be between 19-25. Your Body mass index is 38.45 kg/m. If this is out of the aformentioned range listed, please consider follow up with your Primary Care Provider.   ________________________________________________________  The [facility] providers would like to encourage you to use [facility] to communicate with providers for non-urgent requests or questions.  Due to long hold times on the telephone, sending your provider a message by [facility] may be a faster and more efficient way to get a response.  Please allow 48 business hours for a response.  Please remember that this is for non-urgent requests.  _______________________________________________________

## 2022-11-24 ENCOUNTER — Encounter: Payer: Self-pay | Admitting: Nurse Practitioner

## 2022-11-25 NOTE — Progress Notes (Unsigned)
Referring-C[facility] Jerolyn Center DO Reason for referral-chest pain  HPI: 62 year old female for evaluation of chest pain at request of Milford Cage, DO.  Patient seen June 2024 with an episode of chest pain.  Troponins were normal as well as hemoglobin.  Liver functions and lipase also normal.  Chest x-ray without acute abnormality.  Electrocardiogram is not available for review but was interpreted as normal.  She is referred to cardiology for further evaluation.  She also felt as though it may be esophageal spasm and was seen by GI and a barium swallow has been ordered.  Current Outpatient Medications  Medication Sig Dispense Refill   oxybutynin (DITROPAN-XL) 10 MG 24 hr tablet Take 1 tablet (10 mg total) by mouth daily. 30 tablet 2   Phentermine-Topiramate (QSYMIA) 3.75-23 MG CP24 Take daily x14 days then increase to next strength. 14 capsule 0   Phentermine-Topiramate (QSYMIA) 7.5-46 MG CP24 Take 1 capsule by mouth daily. 30 capsule 2   venlafaxine (EFFEXOR) 75 MG tablet Take 1 tablet (75 mg total) by mouth 2 (two) times daily. 180 tablet 1   No current facility-administered medications for this visit.    No Known Allergies   Past Medical History:  Diagnosis Date   Anxiety    Chills    Cough    Obesity    Unintentional weight loss    Vomiting    Weakness     Past Surgical History:  Procedure Laterality Date   ABDOMINAL HYSTERECTOMY  2006   FIBROIDS   CESAREAN SECTION  1992, 1994   LAPAROSCOPIC GASTRIC BANDING  12/02/2006    Social History   Socioeconomic History   Marital status: Married    Spouse name: Not on file   Number of children: 2   Years of education: Not on file   Highest education level: Some college, no degree  Occupational History   Occupation: sheets  Tobacco Use   Smoking status: Former    Current packs/day: 0.00    Types: Cigarettes    Quit date: 04/02/1993    Years since quitting: 29.6   Smokeless tobacco: Never  Vaping Use   Vaping status: Never  Used  Substance and Sexual Activity   Alcohol use: Yes    Comment: Socially   Drug use: No   Sexual activity: Yes    Birth control/protection: None    Comment: intercourse age 58,more than 5 sexual partners,des neg  Other Topics Concern   Not on file  Social History Narrative   Not on file   Social Determinants of Health   Financial Resource Strain: Low Risk  (08/23/2022)   Overall Financial Resource Strain (CARDIA)    Difficulty of Paying Living Expenses: Not very hard  Food Insecurity: No Food Insecurity (08/23/2022)   Hunger Vital Sign    Worried About Running Out of Food in the Last Year: Never true    Ran Out of Food in the Last Year: Never true  Transportation Needs: No Transportation Needs (08/23/2022)   PRAPARE - Administrator, Civil Service (Medical): No    Lack of Transportation (Non-Medical): No  Physical Activity: Unknown (08/23/2022)   Exercise Vital Sign    Days of Exercise per Week: 0 days    Minutes of Exercise per Session: Not on file  Stress: Stress Concern Present (08/23/2022)   [employer] of Occupational Health - Occupational Stress Questionnaire    Feeling of Stress : To some extent  Social Connections: Moderately Isolated (08/23/2022)  Social Advertising account executive [NHANES]    Frequency of Communication with Friends and Family: More than three times a week    Frequency of Social Gatherings with Friends and Family: Twice a week    Attends Religious Services: Never    Database administrator or Organizations: No    Attends Engineer, structural: Not on file    Marital Status: Married  Catering manager Violence: Not At Risk (07/22/2022)   Received from [facility]   HITS    Over the last 12 months how often did your partner physically hurt you?: 1    Over the last 12 months how often did your partner insult you or talk down to you?: 1    Over the last 12 months how often did your partner threaten you with physical harm?: 1     Over the last 12 months how often did your partner scream or curse at you?: 1    Family History  Problem Relation Age of Onset   Cancer Cousin        breast - mother's cousin   Diabetes Mother    Hypertension Mother    Hypertension Father    Stomach cancer Paternal Grandmother    Colon cancer Neg Hx    Esophageal cancer Neg Hx    Rectal cancer Neg Hx     ROS: no fevers or chills, productive cough, hemoptysis, dysphasia, odynophagia, melena, hematochezia, dysuria, hematuria, rash, seizure activity, orthopnea, PND, pedal edema, claudication. Remaining systems are negative.  Physical Exam:   There were no vitals taken for this visit.  General:  Well developed/well nourished in NAD Skin warm/dry Patient not depressed No peripheral clubbing Back-normal HEENT-normal/normal eyelids Neck supple/normal carotid upstroke bilaterally; no bruits; no JVD; no thyromegaly chest - CTA/ normal expansion CV - RRR/normal S1 and S2; no murmurs, rubs or gallops;  PMI nondisplaced Abdomen -NT/ND, no HSM, no mass, + bowel sounds, no bruit 2+ femoral pulses, no bruits Ext-no edema, chords, 2+ DP Neuro-grossly nonfocal  ECG - personally reviewed  A/P  1 chest pain-  Olga Millers, MD

## 2022-11-26 ENCOUNTER — Ambulatory Visit: Payer: BC Managed Care – PPO | Attending: Cardiology | Admitting: Cardiology

## 2022-11-26 ENCOUNTER — Encounter: Payer: Self-pay | Admitting: Cardiology

## 2022-11-26 VITALS — BP 134/80 | HR 70 | Ht 63.0 in | Wt 228.0 lb

## 2022-11-26 DIAGNOSIS — Z136 Encounter for screening for cardiovascular disorders: Secondary | ICD-10-CM

## 2022-11-26 DIAGNOSIS — R072 Precordial pain: Secondary | ICD-10-CM

## 2022-11-26 DIAGNOSIS — R079 Chest pain, unspecified: Secondary | ICD-10-CM | POA: Diagnosis not present

## 2022-11-26 MED ORDER — METOPROLOL TARTRATE 25 MG PO TABS: ORAL_TABLET | ORAL | 0 refills | Status: DC

## 2022-11-26 NOTE — Patient Instructions (Addendum)
  Testing/Procedures:    Your cardiac CT will be scheduled at   [facility] [address] [place], [place] [address] [phone]  If scheduled at [facility], please arrive at the [facility]'s Entrance (Entrance C2) of [facility] 30 minutes prior to test start time. You can use the FREE valet parking offered at entrance C (encouraged to control the heart rate for the test)  Proceed to the [facility] Radiology Department (first floor) to check-in and test prep.  All radiology patients and guests should use entrance C2 at [facility], accessed from [facility], even though the hospital's physical address listed is [address].    Please follow these instructions carefully (unless otherwise directed):  An IV will be required for this test and Nitroglycerin will be given.   On the Night Before the Test: Be sure to Drink plenty of water. Do not consume any caffeinated/decaffeinated beverages or chocolate 12 hours prior to your test. Do not take any antihistamines 12 hours prior to your test. On the Day of the Test: Drink plenty of water until 1 hour prior to the test. Do not eat any food 1 hour prior to test. You may take your regular medications prior to the test.  Take metoprolol (Lopressor) 100 mg two hours prior to test. FEMALES- please wear underwire-free bra if available, avoid dresses & tight clothing  After the Test: Drink plenty of water. After receiving IV contrast, you may experience a mild flushed feeling. This is normal. On occasion, you may experience a mild rash up to 24 hours after the test. This is not dangerous. If this occurs, you can take Benadryl 25 mg and increase your fluid intake. If you experience trouble breathing, this can be serious. If it is severe call 911 IMMEDIATELY. If it is mild, please call our office. We will call to schedule your test 2-4 weeks out understanding that some  insurance companies will need an authorization prior to the service being performed.   For more information and frequently asked questions, please visit our website : [email or web address]  For non-scheduling related questions, please contact the cardiac imaging nurse navigator should you have any questions/concerns: Cardiac Imaging Nurse Navigators Direct Office Dial: [phone]   For scheduling needs, including cancellations and rescheduling, please call [place], [phone].    Follow-Up: At [facility], you and your health needs are our priority.  As part of our continuing mission to provide you with exceptional heart care, we have created designated Provider Care Teams.  These Care Teams include your primary Cardiologist (physician) and Advanced Practice Providers (APPs -  Physician Assistants and Nurse Practitioners) who all work together to provide you with the care you need, when you need it.  We recommend signing up for the patient portal called "MyChart".  Sign up information is provided on this After Visit Summary.  MyChart is used to connect with patients for Virtual Visits (Telemedicine).  Patients are able to view lab/test results, encounter notes, upcoming appointments, etc.  Non-urgent messages can be sent to your provider as well.   To learn more about what you can do with MyChart, go to [email or web address].    Your next appointment:   12 months Olga Millers MD

## 2022-11-29 ENCOUNTER — Encounter: Payer: Self-pay | Admitting: Family Medicine

## 2022-11-29 ENCOUNTER — Ambulatory Visit (INDEPENDENT_AMBULATORY_CARE_PROVIDER_SITE_OTHER): Payer: BC Managed Care – PPO | Admitting: Family Medicine

## 2022-11-29 ENCOUNTER — Ambulatory Visit (HOSPITAL_COMMUNITY)
Admission: RE | Admit: 2022-11-29 | Discharge: 2022-11-29 | Disposition: A | Discharge status: Home / Self Care | Payer: BC Managed Care – PPO | Source: Ambulatory Visit | Attending: Nurse Practitioner

## 2022-11-29 ENCOUNTER — Telehealth: Payer: Self-pay | Admitting: Nurse Practitioner

## 2022-11-29 VITALS — BP 110/68 | HR 78 | Ht 63.0 in | Wt 228.0 lb

## 2022-11-29 DIAGNOSIS — Z9884 Bariatric surgery status: Secondary | ICD-10-CM | POA: Diagnosis not present

## 2022-11-29 DIAGNOSIS — K2289 Other specified disease of esophagus: Secondary | ICD-10-CM | POA: Diagnosis not present

## 2022-11-29 DIAGNOSIS — E669 Obesity, unspecified: Secondary | ICD-10-CM

## 2022-11-29 DIAGNOSIS — Z23 Encounter for immunization: Secondary | ICD-10-CM | POA: Diagnosis not present

## 2022-11-29 DIAGNOSIS — R079 Chest pain, unspecified: Secondary | ICD-10-CM | POA: Diagnosis not present

## 2022-11-29 DIAGNOSIS — K30 Functional dyspepsia: Secondary | ICD-10-CM | POA: Diagnosis not present

## 2022-11-29 MED ORDER — PHENTERMINE HCL 37.5 MG PO CAPS: 37.5000 mg | ORAL_CAPSULE | ORAL | 0 refills | Status: DC

## 2022-11-29 NOTE — Patient Instructions (Addendum)
Phentermine added but do not start until results of cardiac scan return.  If no heart disease on this you may go ahead and start.

## 2022-11-29 NOTE — Progress Notes (Signed)
[name] - 62 y.o. female MRN [id number]  Date of birth: 1960-10-06  Subjective Chief Complaint  Patient presents with   Weight Loss    HPI [name] is a 62 y.o. female here today for follow up . She was prescribed qsymia but wasn't able to get this since last visit.  Weight is  up 10lbs since last visit. She feels like she is staying pretty active but appetite has been difficulty to manage.  She does feel fatigued throughout the week.  She has not had any further chest pain or discomfort since episode a few months ago.    ROS:  A comprehensive ROS was completed and negative except as noted per HPI     No Known Allergies  Past Medical History:  Diagnosis Date   Anxiety    Cough    Obesity    Unintentional weight loss     Past Surgical History:  Procedure Laterality Date   ABDOMINAL HYSTERECTOMY  2006   FIBROIDS   CESAREAN SECTION  1992, 1994   LAPAROSCOPIC GASTRIC BANDING  12/02/2006    Social History   Socioeconomic History   Marital status: Married    Spouse name: Not on file   Number of children: 2   Years of education: Not on file   Highest education level: Some college, no degree  Occupational History   Occupation: sheets  Tobacco Use   Smoking status: Former    Current packs/day: 0.00    Types: Cigarettes    Quit date: 04/02/1993    Years since quitting: 29.6   Smokeless tobacco: Never  Vaping Use   Vaping status: Never Used  Substance and Sexual Activity   Alcohol use: Yes    Comment: Socially   Drug use: No   Sexual activity: Yes    Birth control/protection: None    Comment: intercourse age 60,more than 5 sexual partners,des neg  Other Topics Concern   Not on file  Social History Narrative   Not on file   Social Determinants of Health   Financial Resource Strain: Medium Risk (11/28/2022)   Overall Financial Resource Strain (CARDIA)    Difficulty of Paying Living Expenses: Somewhat hard  Food Insecurity: No Food Insecurity  (11/28/2022)   Hunger Vital Sign    Worried About Running Out of Food in the Last Year: Never true    Ran Out of Food in the Last Year: Never true  Transportation Needs: No Transportation Needs (11/28/2022)   PRAPARE - Administrator, Civil Service (Medical): No    Lack of Transportation (Non-Medical): No  Physical Activity: Unknown (11/28/2022)   Exercise Vital Sign    Days of Exercise per Week: 0 days    Minutes of Exercise per Session: Not on file  Stress: No Stress Concern Present (11/28/2022)   [employer] of Occupational Health - Occupational Stress Questionnaire    Feeling of Stress : Only a little  Social Connections: Moderately Integrated (11/28/2022)   Social Connection and Isolation Panel [NHANES]    Frequency of Communication with Friends and Family: More than three times a week    Frequency of Social Gatherings with Friends and Family: Three times a week    Attends Religious Services: Never    Active Member of Clubs or Organizations: Yes    Attends Banker Meetings: 1 to 4 times per year    Marital Status: Married    Family History  Problem Relation Age of Onset  Diabetes Mother    Hypertension Mother    Hypertension Father    Stomach cancer Paternal Grandmother    Cancer Cousin        breast - mother's cousin   Colon cancer Neg Hx    Esophageal cancer Neg Hx    Rectal cancer Neg Hx     Health Maintenance  Topic Date Due   Cervical Cancer Screening (HPV/Pap Cotest)  Never done   COVID-19 Vaccine (3 - 2023-24 season) 12/15/2022 (Originally 09/29/2022)   Hepatitis C Screening  12/18/2022 (Originally 03/30/1979)   HIV Screening  12/18/2022 (Originally 03/29/1976)   INFLUENZA VACCINE  04/28/2023 (Originally 08/29/2022)   Zoster Vaccines- Shingrix (1 of 2) 09/07/2023 (Originally 03/30/2011)   MAMMOGRAM  11/08/2023   DTaP/Tdap/Td (2 - Td or Tdap) 06/11/2026   Colonoscopy  08/13/2026   HPV VACCINES  Aged Out      ----------------------------------------------------------------------------------------------------------------------------------------------------------------------------------------------------------------- Physical Exam BP 110/68 (BP Location: Left Arm, Patient Position: Sitting, Cuff Size: Large)   Pulse 78   Ht 5\' 3"  (1.6 m)   Wt 228 lb (103.4 kg)   SpO2 95%   BMI 40.39 kg/m   Physical Exam Constitutional:      Appearance: Normal appearance.  Eyes:     General: No scleral icterus. Cardiovascular:     Rate and Rhythm: Normal rate and regular rhythm.  Pulmonary:     Effort: Pulmonary effort is normal.     Breath sounds: Normal breath sounds.  Neurological:     Mental Status: She is alert.  Psychiatric:        Mood and Affect: Mood normal.        Behavior: Behavior normal.     ------------------------------------------------------------------------------------------------------------------------------------------------------------------------------------------------------------------- Assessment and Plan  Obesity (BMI 35.0-39.9 without comorbidity) Unable to get Qsymia.  Will try phentermine daily to help with weight management.  Side effects and adverse effects reviewed with her.  Advised to not start until results of cardiac work up return.  If this is normal she may go ahead and start.     Meds ordered this encounter  Medications   phentermine 37.5 MG capsule    Sig: Take 1 capsule (37.5 mg total) by mouth every morning.    Dispense:  90 capsule    Refill:  0    Return in about 3 months (around 03/01/2023).    This visit occurred during the SARS-CoV-2 public health emergency.  Safety protocols were in place, including screening questions prior to the visit, additional usage of staff PPE, and extensive cleaning of exam room while observing appropriate contact time as indicated for disinfecting solutions.

## 2022-11-29 NOTE — Telephone Encounter (Signed)
  Refer to Barium swallow study today and result notes. Barium swallow study report showed severe diffuse esophageal dilatation is noted secondary to nearly complete obstruction secondary to lap band device in the proximal stomach.  Dr. Leone Payor reviewed the barium swallow study results and agreed patient should see bariatric surgeon at [facility].  Kaira, please contact patient and let her know that her barium swallow study showed she has esophageal dilatation secondary to her lap band. Please refer to to bariatric surgeon at [facility] asap. THX

## 2022-11-29 NOTE — Assessment & Plan Note (Signed)
Unable to get Qsymia.  Will try phentermine daily to help with weight management.  Side effects and adverse effects reviewed with her.  Advised to not start until results of cardiac work up return.  If this is normal she may go ahead and start.

## 2022-11-29 NOTE — Telephone Encounter (Signed)
Spoke with patient regarding results & recommendations. [facility] referral faxed. Advised patient that she should hear something next week for an appointment, and if not to let us know. Pt verbalized all understanding.

## 2022-12-06 DIAGNOSIS — K224 Dyskinesia of esophagus: Secondary | ICD-10-CM | POA: Diagnosis not present

## 2022-12-06 DIAGNOSIS — Z9884 Bariatric surgery status: Secondary | ICD-10-CM | POA: Diagnosis not present

## 2022-12-09 ENCOUNTER — Telehealth (HOSPITAL_BASED_OUTPATIENT_CLINIC_OR_DEPARTMENT_OTHER): Payer: Self-pay | Admitting: *Deleted

## 2022-12-09 ENCOUNTER — Other Ambulatory Visit (HOSPITAL_COMMUNITY): Payer: Self-pay | Admitting: Surgery

## 2022-12-09 ENCOUNTER — Ambulatory Visit: Payer: Self-pay | Admitting: Surgery

## 2022-12-09 DIAGNOSIS — Z9884 Bariatric surgery status: Secondary | ICD-10-CM

## 2022-12-09 NOTE — Telephone Encounter (Signed)
   Pre-operative Risk Assessment    Patient Name: [name]  DOB: 1960-09-22 MRN: [id number]      Request for Surgical Clearance    Procedure:   LAP BAND REMOVAL AND CONVERSION TO GASTRIC BYPASS  Date of Surgery:  Clearance TBD                                 Surgeon:  DR. PAUL STECHSCHULTE Surgeon's Group or Practice Name:  [facility] Phone number:  [phone] Fax number:  [phone]   Type of Clearance Requested:   - Medical    Type of Anesthesia:  General    Additional requests/questions:    Wilhemina Cash   12/09/2022, 10:41 AM  Jeannie Fend

## 2022-12-11 DIAGNOSIS — Z9884 Bariatric surgery status: Secondary | ICD-10-CM | POA: Diagnosis not present

## 2022-12-11 DIAGNOSIS — K224 Dyskinesia of esophagus: Secondary | ICD-10-CM | POA: Diagnosis not present

## 2022-12-12 ENCOUNTER — Telehealth (HOSPITAL_COMMUNITY): Payer: Self-pay | Admitting: *Deleted

## 2022-12-12 NOTE — Telephone Encounter (Signed)
Attempted to call patient regarding upcoming cardiac CT appointment. Left message on voicemail with name and callback number Hayley Sharpe RN Navigator Cardiac Imaging [facility] [phone] Office   

## 2022-12-13 ENCOUNTER — Ambulatory Visit (HOSPITAL_COMMUNITY)
Admission: RE | Admit: 2022-12-13 | Discharge: 2022-12-13 | Disposition: A | Discharge status: Home / Self Care | Payer: BC Managed Care – PPO | Source: Ambulatory Visit | Attending: Surgery

## 2022-12-13 ENCOUNTER — Other Ambulatory Visit (HOSPITAL_COMMUNITY): Payer: BC Managed Care – PPO

## 2022-12-13 ENCOUNTER — Ambulatory Visit (HOSPITAL_COMMUNITY)
Admission: RE | Admit: 2022-12-13 | Discharge: 2022-12-13 | Disposition: A | Discharge status: Home / Self Care | Payer: BC Managed Care – PPO | Source: Ambulatory Visit | Attending: Cardiology | Admitting: Cardiology

## 2022-12-13 DIAGNOSIS — Z9884 Bariatric surgery status: Secondary | ICD-10-CM

## 2022-12-13 DIAGNOSIS — R072 Precordial pain: Secondary | ICD-10-CM | POA: Diagnosis not present

## 2022-12-13 DIAGNOSIS — J984 Other disorders of lung: Secondary | ICD-10-CM | POA: Diagnosis not present

## 2022-12-13 DIAGNOSIS — R079 Chest pain, unspecified: Secondary | ICD-10-CM | POA: Diagnosis not present

## 2022-12-13 MED ORDER — NITROGLYCERIN 0.4 MG SL SUBL
SUBLINGUAL_TABLET | SUBLINGUAL | Status: AC
  Filled 2022-12-13: qty 2

## 2022-12-13 MED ORDER — NITROGLYCERIN 0.4 MG SL SUBL
0.8000 mg | SUBLINGUAL_TABLET | Freq: Once | SUBLINGUAL | Status: AC
Start: 2022-12-13 — End: 2022-12-13
  Administered 2022-12-13: 0.8 mg via SUBLINGUAL

## 2022-12-13 MED ORDER — IOHEXOL 350 MG/ML SOLN
100.0000 mL | Freq: Once | INTRAVENOUS | Status: AC | PRN
  Administered 2022-12-13: 100 mL via INTRAVENOUS

## 2022-12-16 NOTE — Telephone Encounter (Signed)
   Patient Name: [name]  DOB: 1960-02-20 MRN: [id number]  Primary Cardiologist: None  Chart reviewed as part of pre-operative protocol coverage. Given past medical history and time since last visit, based on ACC/AHA guidelines, [name] is at acceptable risk for the planned procedure without further cardiovascular testing.   Patient underwent coronary CTA that showed no evidence of obstructive disease on 12/13/2022  The patient was advised that if she develops new symptoms prior to surgery to contact our office to arrange for a follow-up visit, and she verbalized understanding.  I will route this recommendation to the requesting party via Epic fax function and remove from pre-op pool.  Please call with questions.  Napoleon Form, Leodis Rains, NP 12/16/2022, 8:35 AM

## 2022-12-18 ENCOUNTER — Encounter: Payer: Self-pay | Admitting: *Deleted

## 2022-12-19 ENCOUNTER — Other Ambulatory Visit: Payer: Self-pay | Admitting: Family Medicine

## 2022-12-19 DIAGNOSIS — Z1231 Encounter for screening mammogram for malignant neoplasm of breast: Secondary | ICD-10-CM

## 2022-12-25 ENCOUNTER — Ambulatory Visit: Payer: BC Managed Care – PPO

## 2022-12-25 DIAGNOSIS — Z1231 Encounter for screening mammogram for malignant neoplasm of breast: Secondary | ICD-10-CM

## 2022-12-31 ENCOUNTER — Other Ambulatory Visit: Payer: Self-pay | Admitting: *Deleted

## 2022-12-31 DIAGNOSIS — R911 Solitary pulmonary nodule: Secondary | ICD-10-CM

## 2023-01-03 ENCOUNTER — Encounter: Payer: BC Managed Care – PPO | Attending: Surgery | Admitting: Dietician

## 2023-01-03 ENCOUNTER — Encounter: Payer: Self-pay | Admitting: Dietician

## 2023-01-03 VITALS — Ht 64.0 in | Wt 227.0 lb

## 2023-01-03 DIAGNOSIS — E669 Obesity, unspecified: Secondary | ICD-10-CM | POA: Insufficient documentation

## 2023-01-03 NOTE — Progress Notes (Signed)
Nutrition Assessment for Bariatric Surgery: Pre-Surgery Behavioral and Nutrition Intervention Program   Medical Nutrition Therapy  Appt Start Time: 8:09    End Time: 8:14  Patient was seen on 01/03/2023 for Pre-Operative Nutrition Assessment. Purpose of todays visit  enhance perioperative outcomes along with a healthy weight maintenance   Referral stated Supervised Weight Loss (SWL) visits needed: 0  Pt completed visits.   Pt has cleared nutrition requirements.   Planned surgery: Band removal, conversation to RYGB Pt expectation of surgery: to get to 160 lbs  NUTRITION ASSESSMENT   Anthropometrics  Start weight at [facility]: 227.0 lbs (date: 01/03/2023)  Height: 64 in BMI: 38.96 kg/m2     Clinical   Pharmacotherapy: History of weight loss medication used: phentermine (not currently)  Medical hx: obesity, vit D, vit C Medications: efferox   Labs: Vit D 27.9; iron saturation 8 Notable signs/symptoms: none noted Any previous deficiencies? No  Evaluation of Nutritional Deficiencies: Micronutrient Nutrition Focused Physical Exam: Hair: No issues observed Eyes: No issues observed Mouth: No issues observed Neck: No issues observed Nails: No issues observed Skin: No issues observed  Lifestyle & Dietary Hx  Pt had lap band place in 2008, stating she has had issues with it. Pt states her mother died one year ago, stating she has gained some weight recently.   Current Physical Activity Recommendations state 150 minutes per week of moderate to vigorous movement including Cardio and 1-2 days of resistance activities as well as flexibility/balance activities:  Pts current physical activity: ADLs, with 0% recommendation reached   Sleep Hygiene: duration and quality: very good  Current Patient Perceived Stress Level as stated by pt on a scale of 1-10:  8       Stress Management Techniques: deal with it, taking it day by day; keep a positive attitude  According to the Dietary  Guidelines for Americans Recommendation: equivalent 1.5-2 cups fruits per day, equivalent 2-3 cups vegetables per day and at least half all grains whole  Fruit servings per day (on average): 2, meeting 100% recommendation  Non-starchy vegetable servings per day (on average): 2, meeting 66-100% recommendation  Whole Grains per day (on average): 0  Number of meals missed/skipped per week out of 21: 2  24-Hr Dietary Recall First Meal: protein shake, coffee, flavorings Snack: banana Second Meal: salad Snack: cheese/salami Third Meal: spaghetti or pork chops, pinto beans, cabbage or chicken pot pie, mashed potatoes, peas Snack: popcorn with flavorings Beverages: water, coffee  Alcoholic beverages per week: 0 (six times a year)   Estimated Energy Needs Calories: 1500   NUTRITION DIAGNOSIS  Overweight/obesity ([place]-3.3) related to past poor dietary habits and physical inactivity as evidenced by patient w/ planned lap band conversion to RYGB surgery following dietary guidelines for continued weight loss.    NUTRITION INTERVENTION  Nutrition counseling (C-1) and education (E-2) to facilitate bariatric surgery goals.  Educated pt on micronutrient deficiencies post-surgery and behavioral/dietary strategies to start in order to mitigate that risk   Behavioral and Dietary Interventions Pre-Op Goals Reviewed with the Patient Nutrition: Healthy Eating Behaviors Switch to non-caloric, non-carbonated and non-caffeinated beverages such as  water, unsweetened tea, Crystal Light and zero calorie beverages (aim for 64 oz. per day) Cut out grazing between meals or at night  Find a protein shake you like Eat every 3-5 hours        Eliminate distractions while eating (TV, computer, reading, driving, texting) Take 47-82 minutes to eat a meal  Decrease high sugar foods/decrease high fat/fried  foods Eliminate alcoholic beverages Increase protein intake (eggs, fish, chicken, yogurt) before surgery Eat  non starchy vegetables 2 times a day 7 days a week Eat complex carbohydrates such as whole grains and fruits   Behavioral Modification: Physical Activity Increase my usual daily activity (use stairs, park farther, etc.) Engage in [place] activity  60 minutes 3 times per week  Other:    _________________________________________________________________     Problem Solving I will think about my usual eating patterns and how to tweak them How can my friends and family support me Barriers to starting my changes Learn and understand appetite verses hunger   Healthy Coping Allow for ___________ activities per week to help me manage stress Reframe negative thoughts I will keep a picture of someone or something that is my inspiration & look at it daily   Monitoring  Weigh myself once a week  Measure my progress by monitoring how my clothes fit Keep a food record of what I eat and drink for the next ________ (time period) Take pictures of what I eat and drink for the next ________ (time period) Use an app to count steps/day for the next_______ (time period) Measure my progress such as increased energy and more restful sleep Monitor your acid reflux and bowel habits, are they getting better?   *Goals that are bolded indicate the pt would like to start working towards these  Handouts Provided Include  Bariatric Surgery handouts (Nutrition Visits, Pre Surgery Behavioral Change Goals, Protein Shakes Brands to Choose From, Vitamins & Mineral Supplementation)  Learning Style & Readiness for Change Teaching method utilized: Visual, Auditory, and hands on  Demonstrated degree of understanding via: Teach Back  Readiness Level: preparation Barriers to learning/adherence to lifestyle change: nothing identified  RD's Notes for Next Visit    MONITORING & EVALUATION Dietary intake, weekly physical activity, body weight, and preoperative behavioral change goals   Next Steps  Pt has completed visits.  No further supervised visits required/recommended. Patient is to follow up at [facility] for pre-op class >3 weeks prior to scheduled surgery.

## 2023-01-14 ENCOUNTER — Ambulatory Visit (INDEPENDENT_AMBULATORY_CARE_PROVIDER_SITE_OTHER): Payer: BC Managed Care – PPO | Admitting: Licensed Clinical Social Worker

## 2023-01-14 DIAGNOSIS — F4322 Adjustment disorder with anxiety: Secondary | ICD-10-CM | POA: Diagnosis not present

## 2023-01-15 NOTE — Progress Notes (Signed)
Comprehensive Clinical Assessment (CCA) Note  01/15/2023 [name] [id number]  Chief Complaint:  Chief Complaint  Patient presents with   Obesity   Visit Diagnosis: Adjustment disorder with anxious mood     CCA Biopsychosocial Intake/Chief Complaint:  Bariatric surgery  Current Symptoms/Problems: can feel anxious at times: she is raising grandchildren: granddaughter is 41 and grandson is 6,  some difficulty with sleep: thinks about the day and the next day related to what she needs to do for kids, can get irritable having to remind the grandkids, mother passed last Nov-has gained weight since then, grief, No psychosis, No SI/HI   Patient Reported Schizophrenia/Schizoaffective Diagnosis in Past: No   Strengths: smart, funny, creative, good friend, good listener, hard worker  Preferences: prefers attention/large crowds, doesn't prefer lazy people  Abilities: gardening, party planning, decorating, organization, cleanliness, neat freak, goes all in on what she does   Type of Services Patient Feels are Needed: Bariatric surgery   Initial Clinical Notes/Concerns: History of weight: Weight started to increase after having children,   Weight loss attempts: weight watchers, phentermine, diet, exercise,  Current diet: High protein, low carb, working on reducing soda,  Co-morbid: None,  Previous procedures: 2 C-sections, hysterrechtomy, lapband, recovered well,  Family History of obesity: at some point all her family has struggled with weight   Mental Health Symptoms Depression:  Tearfulness   Duration of Depressive symptoms: Greater than two weeks   Mania:  None   Anxiety:   Worrying; Sleep; Tension; Irritability; Difficulty concentrating; Fatigue   Psychosis:  None   Duration of Psychotic symptoms: No data recorded  Trauma:  None   Obsessions:  None   Compulsions:  None   Inattention:  None   Hyperactivity/Impulsivity:  None   Oppositional/Defiant Behaviors:   None   Emotional Irregularity:  None   Other Mood/Personality Symptoms:  None    Mental Status Exam Appearance and self-care  Stature:  Average   Weight:  Obese   Clothing:  Casual   Grooming:  Normal   Cosmetic use:  Age appropriate   Posture/gait:  Normal   Motor activity:  Not Remarkable   Sensorium  Attention:  Normal   Concentration:  Normal   Orientation:  X5   Recall/memory:  Normal   Affect and Mood  Affect:  Appropriate   Mood:  Euthymic   Relating  Eye contact:  Normal   Facial expression:  Responsive   Attitude toward examiner:  Cooperative   Thought and Language  Speech flow: Normal   Thought content:  Appropriate to Mood and Circumstances   Preoccupation:  None   Hallucinations:  None   Organization:  No data recorded  [employer] of Knowledge:  Good   Intelligence:  Average   Abstraction:  Normal   Judgement:  Normal   Reality Testing:  Realistic   Insight:  Good   Decision Making:  Normal   Social Functioning  Social Maturity:  Responsible   Social Judgement:  Normal   Stress  Stressors:  Grief/losses; Other (Comment) (Grandparent raising grandchildren)   Coping Ability:  Deficient supports   Skill Deficits:  None   Supports:  Family; Church     Religion: Religion/Spirituality Are You A Religious Person?: Yes What is Your Religious Affiliation?: Baptist How Might This Affect Treatment?: Support in treatment  Leisure/Recreation: Leisure / Recreation Do You Have Hobbies?: Yes Leisure and Hobbies: shopping, spend time with family  Exercise/Diet: Exercise/Diet Do You Exercise?: Yes What Type  of Exercise Do You Do?: Run/Walk How Many Times a Week Do You Exercise?: 4-5 times a week Have You Gained or Lost A Significant Amount of Weight in the Past Six Months?: Yes-Gained Number of Pounds Gained: 35 Do You Follow a Special Diet?: Yes Type of Diet: see above Do You Have Any Trouble Sleeping?:  Yes Explanation of Sleeping Difficulties: worries about the next day for grandchildren   CCA Employment/Education Employment/Work Situation: Employment / Work Situation Employment Situation: Employed Where is Patient Currently Employed?: [employer] Long has Patient Been Employed?: 3 years Are You Satisfied With Your Job?: Yes Do You Work More Than One Job?: No Work Stressors: None Patient's Job has Been Impacted by Current Illness: No What is the Longest Time Patient has Held a Job?: 33 years Where was the Patient Employed at that Time?: [employer] Has Patient ever Been in the [employer]?: No  Education: Education Is Patient Currently Attending School?: No Last Grade Completed: 12 Name of High School: [employer] Did Garment/textile technologist From [employer]?: Yes Did Theme park manager?:  (Some college) Did Designer, television/film set?: No Did You Have Any Special Interests In School?: All classes Did You Have An Individualized Education Program (IIEP): No Did You Have Any Difficulty At School?: No Patient's Education Has Been Impacted by Current Illness: No   CCA Family/Childhood History Family and Relationship History: Family history Marital status: Married Number of Years Married: 32 What types of issues is patient dealing with in the relationship?: None Additional relationship information: None Are you sexually active?: Yes What is your sexual orientation?: Heterosexual Has your sexual activity been affected by drugs, alcohol, medication, or emotional stress?: None Does patient have children?: Yes How many children?: 2 How is patient's relationship with their children?: 2 daughters: oldest daughter: close, youngest daughter: ok  Childhood History:  Childhood History By whom was/is the patient raised?: Both parents Additional childhood history information: Both parents in the home. Patient describes childhood as "great." Description of patient's relationship with  caregiver when they were a child: Mother: great, Father: he worked a Event organiser description of current relationship with people who raised him/her: Mother: deceased, Father: great relationship How were you disciplined when you got in trouble as a child/adolescent?: spanked, yelled Does patient have siblings?: Yes Number of Siblings: 4 Description of patient's current relationship with siblings: 3 sisters, 1 brother: good/close Did patient suffer any verbal/emotional/physical/sexual abuse as a child?: No Did patient suffer from severe childhood neglect?: No Has patient ever been sexually abused/assaulted/raped as an adolescent or adult?: No Was the patient ever a victim of a crime or a disaster?: No Witnessed domestic violence?: No Has patient been affected by domestic violence as an adult?: No  Child/Adolescent Assessment:     CCA Substance Use Alcohol/Drug Use: Alcohol / Drug Use Pain Medications: See patient MAR Prescriptions: See patient MAR Over the Counter: See patient MAR History of alcohol / drug use?: No history of alcohol / drug abuse                         ASAM's:  Six Dimensions of Multidimensional Assessment  Dimension 1:  Acute Intoxication and/or Withdrawal Potential:   Dimension 1:  Description of individual's past and current experiences of substance use and withdrawal: None  Dimension 2:  Biomedical Conditions and Complications:   Dimension 2:  Description of patient's biomedical conditions and  complications: None  Dimension 3:  Emotional, Behavioral, or Cognitive  Conditions and Complications:  Dimension 3:  Description of emotional, behavioral, or cognitive conditions and complications: None  Dimension 4:  Readiness to Change:  Dimension 4:  Description of Readiness to Change criteria: None  Dimension 5:  Relapse, Continued use, or Continued Problem Potential:  Dimension 5:  Relapse, continued use, or continued problem potential critiera  description: None  Dimension 6:  Recovery/Living Environment:  Dimension 6:  Recovery/Iiving environment criteria description: None  ASAM Severity Score: ASAM's Severity Rating Score: 0  ASAM Recommended Level of Treatment:     Substance use Disorder (SUD)    Recommendations for Services/Supports/Treatments: Recommendations for Services/Supports/Treatments Recommendations For Services/Supports/Treatments: Other (Comment) (Bariatric surgery)  DSM5 Diagnoses: Patient Active Problem List   Diagnosis Date Noted   Esophageal spasm 08/23/2022   Obesity (BMI 35.0-39.9 without comorbidity) 08/23/2022   Acute non-recurrent maxillary sinusitis 01/22/2022   Acute cough 01/22/2022   Pes anserine bursitis 12/17/2021   Mixed stress and urge urinary incontinence 10/15/2021   Osteopenia after menopause 10/15/2021   Anxiety     Patient Centered Plan: Patient is on the following Treatment Plan(s):  No treatment plan needed  Behavioral Health Assessment Patient Name [name] Date of Birth [birth year]  Age 48 y.o.  Date of Interview 01/14/2023   Gender female  Date of Report 12.18.2024  Purpose Bariatric/Weight-loss Surgery (pre-operative evaluation)     Assessment Instruments:  DSM-5-TR Self-Rated Level 1 Cross-Cutting Symptom Measure--Adult Severity Measure for Generalized Anxiety Disorder--Adult EAT-26  Chief Complain: Obesity  Client Background: Patient is a 62 y.o. Caucasian female seeking weight loss surgery. Patient has a high school degree, some college, and works at [employer].  Patient is Married . The patient is 5 feet 4 inches tall and 227 lbs., placing her at a BMI of 39.0  classifying her in the obese range and at further risk of co-morbid diseases.  Weight History:  Patient's weight started to increase in adulthood after having children. Patient has tried weight watchers, phentermine, diet, and exercise.   Eating Patterns:  Patient is focusing on high protein and low carb.  Patient is working on reducing soda as well.   Related Medical Issues:   Patient has no co-morbid diagnosis. Patient has had 2 C-sections, hysterectomy, lapband, and she recovered well from each procedure.    Family History of Obesity:  Patient's has a family history of obesity on both sides of family.   Tobacco Use: Patient denies tobacco use.   PATIENT BEHAVIORAL ASSESSMENT SCORES  Personal History of Mental Illness: Patient denies current treatment for depression and anxiety. Patient has had treatment in the past.  Mental Status Examination: Patient was oriented x5 (person, place, situation, time, and object). He was appropriately groomed, and neatly dressed. Patient was alert, engaged, pleasant, and cooperative. Patient denies suicidal and homicidal ideations. Patient denies self-injury. Patient denies psychosis including auditory and visual hallucinations  DSM-5-TR Self-Rated Level 1 Cross-Cutting Symptom Measure--Adult:  Patient rated herself a 1 on the Depressive domain indicating mild or rare symptoms. Patient can feel irritated or down due to being a grandparent raising grandchildren.   Severity Measure for Generalized Anxiety Disorder--Adult: Patient completed a 10-question scale. Total scores can range from 0 to 40. A raw score is calculated by summing the answer to each question, and an average total score is achieved by dividing the raw score by the number of items (e.g., 10). Patient had a total raw score of 3 out of 40 which was divided by the total number of questions  answered (10) to get an average score of . 3 which indicates no significant anxiety.   EAT-26: The EAT-26 is a twenty-six-question screening tool to identify symptoms of eating disorders and disordered eating. The patient scored 3 out of 26. Scores below a 20 are considered not meeting criteria for disordered eating. Patient denies inducing vomiting, or intentional meal skipping. Patient denies binge eating behaviors.  Patient denies laxative abuse. Patient does not meet criteria for a DSM-V eating disorder.  Conclusion & Recommendations:   [name] health history and current assessment indicate that she is suitable for bariatric surgery. Patient understands the procedure, the risks associated with it, and the importance of post-operative holistic care (Physical, Spiritual/Values, Relationships, and Mental/Emotional health) with access to resources for support as needed. The patient has made an informed decision to proceed with the procedure. The patient is motivated and expressed understanding of the post-surgical requirements. Patient's psychological assessment will be valid from today's date for 6 months (05.18.2025). Then, a follow-up appointment will be needed to re-evaluate the patient's psychological status.  I see no significant psychological factors that would hinder the success of bariatric surgery. I support [name] desire for Bariatric Surgery.   Bynum Bellows, LCSW    Referrals to Alternative Service(s): Referred to Alternative Service(s):   Place:   Date:   Time:    Referred to Alternative Service(s):   Place:   Date:   Time:    Referred to Alternative Service(s):   Place:   Date:   Time:    Referred to Alternative Service(s):   Place:   Date:   Time:      Collaboration of Care: Other provider involved in patient's care [facility]  Patient/Guardian was advised Release of Information must be obtained prior to any record release in order to collaborate their care with an outside provider. Patient/Guardian was advised if they have not already done so to contact the registration department to sign all necessary forms in order for [place] to release information regarding their care.   Consent: Patient/Guardian gives verbal consent for treatment and assignment of benefits for services provided during this visit. Patient/Guardian expressed understanding and agreed to proceed.    Bynum Bellows, LCSW

## 2023-01-17 ENCOUNTER — Ambulatory Visit: Payer: BC Managed Care – PPO | Admitting: Cardiology

## 2023-02-11 ENCOUNTER — Encounter (HOSPITAL_COMMUNITY): Payer: Self-pay | Admitting: Surgery

## 2023-02-11 NOTE — Progress Notes (Signed)
 Attempted to obtain medical history for pre op call via telephone, unable to reach at this time. HIPAA compliant voicemail message left requesting return call to pre surgical testing department.

## 2023-02-17 ENCOUNTER — Ambulatory Visit (HOSPITAL_COMMUNITY): Payer: Self-pay | Admitting: Licensed Clinical Social Worker

## 2023-02-17 NOTE — Anesthesia Preprocedure Evaluation (Signed)
Anesthesia Evaluation  Patient identified by MRN, date of birth, ID band Patient awake    Reviewed: Allergy & Precautions, NPO status , Patient's Chart, lab work & pertinent test results  Airway Mallampati: II  TM Distance: >3 FB Neck ROM: Full    Dental no notable dental hx. (+) Teeth Intact, Dental Advisory Given   Pulmonary former smoker   Pulmonary exam normal breath sounds clear to auscultation       Cardiovascular Normal cardiovascular exam Rhythm:Regular Rate:Normal     Neuro/Psych   Anxiety     negative neurological ROS     GI/Hepatic Neg liver ROS,GERD  Medicated,,  Endo/Other  negative endocrine ROS    Renal/GU      Musculoskeletal   Abdominal   Peds  Hematology   Anesthesia Other Findings   Reproductive/Obstetrics                             Anesthesia Physical Anesthesia Plan  ASA: 3  Anesthesia Plan: MAC   Post-op Pain Management: Minimal or no pain anticipated   Induction:   PONV Risk Score and Plan: Propofol infusion and Treatment may vary due to age or medical condition  Airway Management Planned: Natural Airway and Nasal Cannula  Additional Equipment: None  Intra-op Plan:   Post-operative Plan:   Informed Consent: I have reviewed the patients History and Physical, chart, labs and discussed the procedure including the risks, benefits and alternatives for the proposed anesthesia with the patient or authorized representative who has indicated his/her understanding and acceptance.     Dental advisory given  Plan Discussed with: CRNA and Anesthesiologist  Anesthesia Plan Comments: (EGD for GERD)        Anesthesia Quick Evaluation

## 2023-02-18 ENCOUNTER — Ambulatory Visit (HOSPITAL_COMMUNITY): Payer: Self-pay | Admitting: Anesthesiology

## 2023-02-18 ENCOUNTER — Encounter (HOSPITAL_COMMUNITY): Payer: Self-pay | Admitting: Surgery

## 2023-02-18 ENCOUNTER — Encounter (HOSPITAL_COMMUNITY)
Admission: RE | Disposition: A | Discharge status: Home / Self Care | Payer: Self-pay | Source: Home / Self Care | Attending: Surgery

## 2023-02-18 ENCOUNTER — Other Ambulatory Visit: Payer: Self-pay

## 2023-02-18 ENCOUNTER — Ambulatory Visit (HOSPITAL_COMMUNITY)
Admission: RE | Admit: 2023-02-18 | Discharge: 2023-02-18 | Disposition: A | Discharge status: Home / Self Care | Payer: BC Managed Care – PPO | Attending: Surgery | Admitting: Surgery

## 2023-02-18 DIAGNOSIS — R1310 Dysphagia, unspecified: Secondary | ICD-10-CM | POA: Insufficient documentation

## 2023-02-18 DIAGNOSIS — K2950 Unspecified chronic gastritis without bleeding: Secondary | ICD-10-CM | POA: Insufficient documentation

## 2023-02-18 DIAGNOSIS — Z6838 Body mass index (BMI) 38.0-38.9, adult: Secondary | ICD-10-CM | POA: Diagnosis not present

## 2023-02-18 DIAGNOSIS — Z9884 Bariatric surgery status: Secondary | ICD-10-CM | POA: Insufficient documentation

## 2023-02-18 DIAGNOSIS — K219 Gastro-esophageal reflux disease without esophagitis: Secondary | ICD-10-CM | POA: Insufficient documentation

## 2023-02-18 DIAGNOSIS — Z87891 Personal history of nicotine dependence: Secondary | ICD-10-CM | POA: Insufficient documentation

## 2023-02-18 DIAGNOSIS — K319 Disease of stomach and duodenum, unspecified: Secondary | ICD-10-CM | POA: Diagnosis not present

## 2023-02-18 DIAGNOSIS — K9509 Other complications of gastric band procedure: Secondary | ICD-10-CM | POA: Diagnosis not present

## 2023-02-18 DIAGNOSIS — K2289 Other specified disease of esophagus: Secondary | ICD-10-CM | POA: Diagnosis not present

## 2023-02-18 HISTORY — PX: BIOPSY: SHX5522

## 2023-02-18 HISTORY — PX: ESOPHAGOGASTRODUODENOSCOPY: SHX5428

## 2023-02-18 SURGERY — EGD (ESOPHAGOGASTRODUODENOSCOPY)
Anesthesia: Monitor Anesthesia Care

## 2023-02-18 MED ORDER — PROPOFOL 500 MG/50ML IV EMUL
INTRAVENOUS | Status: DC | PRN
  Administered 2023-02-18: 130 ug/kg/min via INTRAVENOUS

## 2023-02-18 MED ORDER — PROPOFOL 500 MG/50ML IV EMUL
INTRAVENOUS | Status: AC
  Filled 2023-02-18: qty 50

## 2023-02-18 MED ORDER — SODIUM CHLORIDE 0.9 % IV SOLN: INTRAVENOUS | Status: DC | PRN

## 2023-02-18 MED ORDER — PROPOFOL 10 MG/ML IV BOLUS
INTRAVENOUS | Status: DC | PRN
  Administered 2023-02-18 (×3): 30 mg via INTRAVENOUS

## 2023-02-18 MED ORDER — LIDOCAINE 2% (20 MG/ML) 5 ML SYRINGE
INTRAMUSCULAR | Status: DC | PRN
  Administered 2023-02-18: 100 mg via INTRAVENOUS

## 2023-02-18 NOTE — H&P (Signed)
Admitting Physician: Hyman Hopes Mj Willis  Service: Bariatric surgery  CC: Dysphagia after lap band placement  Subjective   HPI: [name] is an 63 y.o. female who is here for EGD prior to removal of lap band with conversion to gastric bypass.  Past Medical History:  Diagnosis Date   Anxiety    Cough    Obesity    Unintentional weight loss     Past Surgical History:  Procedure Laterality Date   ABDOMINAL HYSTERECTOMY  2006   FIBROIDS   CESAREAN SECTION  1992, 1994   LAPAROSCOPIC GASTRIC BANDING  12/02/2006    Family History  Problem Relation Age of Onset   Diabetes Mother    Hypertension Mother    Hypertension Father    Stomach cancer Paternal Grandmother    Cancer Cousin        breast - mother's cousin   Colon cancer Neg Hx    Esophageal cancer Neg Hx    Rectal cancer Neg Hx     Social:  reports that she quit smoking about 29 years ago. Her smoking use included cigarettes. She has never used smokeless tobacco. She reports current alcohol use. She reports that she does not use drugs.  Allergies: No Known Allergies  Medications: Current Outpatient Medications  Medication Instructions   metoprolol tartrate (LOPRESSOR) 25 MG tablet Take 2 hours before CTA scan.   phentermine 37.5 mg, Oral, BH-each morning   venlafaxine (EFFEXOR) 75 mg, Oral, 2 times daily    ROS - all of the below systems have been reviewed with the patient and positives are indicated with bold text General: chills, fever or night sweats Eyes: blurry vision or double vision ENT: epistaxis or sore throat Allergy/Immunology: itchy/watery eyes or nasal congestion Hematologic/Lymphatic: bleeding problems, blood clots or swollen lymph nodes Endocrine: temperature intolerance or unexpected weight changes Breast: new or changing breast lumps or nipple discharge Resp: cough, shortness of breath, or wheezing CV: chest pain or dyspnea on exertion GI: as per HPI GU: dysuria, trouble voiding, or  hematuria MSK: joint pain or joint stiffness Neuro: TIA or stroke symptoms Derm: pruritus and skin lesion changes Psych: anxiety and depression  Objective   PE Weight 101.6 kg. Constitutional: NAD; conversant; no deformities Eyes: Moist conjunctiva; no lid lag; anicteric; PERRL Neck: Trachea midline; no thyromegaly Lungs: Normal respiratory effort; no tactile fremitus CV: RRR; no palpable thrills; no pitting edema GI: Abd soft, nontender; no palpable hepatosplenomegaly MSK: Normal range of motion of extremities; no clubbing/cyanosis Psychiatric: Appropriate affect; alert and oriented x3 Lymphatic: No palpable cervical or axillary lymphadenopathy  No results found for this or any previous visit (from the past 24 hours).  Imaging Orders  No imaging studies ordered today     Assessment and Plan   [name] is a 63 y.o. female who underwent adjustable gastric band by Dr. Johna Sheriff in 2008. Recent upper GI x-ray shows the band nearly completely occludes the upper stomach and there is significant esophageal tortuosity and dilation above this obstruction. Discussed options moving forward. I offered to remove the Lap-Band and convert her to a sleeve or bypass. I explained my recommendation would be to convert to a gastric bypass as this is a better surgery when it comes to reflux, and I am afraid her lower esophageal sphincter function may be impaired due to her history of Lap-Band obstruction. I also discussed we could just remove the Lap-Band though this would be met with weight regain issues. She is interested in  Lap-Band removal with conversion to a gastric bypass.    Today she presents for upper endoscopy with biopsy. I explained my rational for performing upper endoscopy in my bariatric patients. During the procedure I will biopsy for H. Pylori, evaluate for hiatal hernia, evaluate for reflux esophagitis and look for any other abnormalities that may influence the procedure. We  discussed the risks, benefits and alternative to this procedure and the patient granted consent to proceed.  After the above evaluation is complete, we will continue our discussion and work towards scheduling surgery.  Quentin Ore, MD  [facility] Use [email or web address] to contact on call provider

## 2023-02-18 NOTE — Anesthesia Postprocedure Evaluation (Signed)
Anesthesia Post Note  Patient: [name]  Procedure(s) Performed: ESOPHAGOGASTRODUODENOSCOPY (EGD) BIOPSY     Patient location during evaluation: Endoscopy Anesthesia Type: MAC Level of consciousness: awake and alert Pain management: pain level controlled Vital Signs Assessment: post-procedure vital signs reviewed and stable Respiratory status: spontaneous breathing, nonlabored ventilation, respiratory function stable and patient connected to nasal cannula oxygen Cardiovascular status: blood pressure returned to baseline and stable Postop Assessment: no apparent nausea or vomiting Anesthetic complications: no   No notable events documented.  Last Vitals:  Vitals:   02/18/23 1400 02/18/23 1410  BP: (!) 127/57 (!) 135/59  Pulse: 78 65  Resp: 18 18  Temp:    SpO2: 100% 100%    Last Pain:  Vitals:   02/18/23 1410  TempSrc:   PainSc: 0-No pain                 Trevor Iha

## 2023-02-18 NOTE — Op Note (Signed)
[facility] Patient Name: [name] Procedure Date: 02/18/2023 MRN: [id number] Attending MD: Quentin Ore , ,  Date of Birth: September 06, 1960 CSN: [id number] Age: 63 Admit Type: Outpatient Procedure:                Upper GI endoscopy Indications:               Providers:                Quentin Ore, Marge Duncans, RN, Rhodia Albright, Technician, Marja Kays, Technician Referring MD:              Medicines:                Monitored Anesthesia Care Complications:            No immediate complications. Estimated Blood Loss:     Estimated blood loss was minimal. Procedure:                Pre-Anesthesia Assessment:                           - Prior to the procedure, a History and Physical                            was performed, and patient medications and                            allergies were reviewed. The patient is competent.                            The risks and benefits of the procedure and the                            sedation options and risks were discussed with the                            patient. All questions were answered and informed                            consent was obtained. Patient identification and                            proposed procedure were verified by the physician                            in the pre-procedure area. Mental Status                            Examination: alert and oriented. Airway                            Examination: normal oropharyngeal airway and neck                            mobility.  Respiratory Examination: clear to                            auscultation. CV Examination: normal. ASA Grade                            Assessment: II - A patient with mild systemic                            disease. After reviewing the risks and benefits,                            the patient was deemed in satisfactory condition to                            undergo the procedure.  The anesthesia plan was to                            use monitored anesthesia care (MAC). Immediately                            prior to administration of medications, the patient                            was re-assessed for adequacy to receive sedatives.                            The heart rate, respiratory rate, oxygen                            saturations, blood pressure, adequacy of pulmonary                            ventilation, and response to care were monitored                            throughout the procedure. The physical status of                            the patient was re-assessed after the procedure.                           After obtaining informed consent, the endoscope was                            passed under direct vision. Throughout the                            procedure, the patient's blood pressure, pulse, and                            oxygen saturations were monitored continuously. The  [id number]-[id number] ([id number] Olympus endoscope was introduced                            through the mouth, and advanced to the second part                            of duodenum. The upper GI endoscopy was                            accomplished without difficulty. The patient                            tolerated the procedure well. Scope In: Scope Out: Findings:      The lumen of the esophagus was severely dilated.      The examined esophagus was significantly tortuous.      Evidence of an adjustable gastric banding was found in the stomach.      The examined duodenum was normal.      The gastric antrum was normal. Biopsies were taken with a cold forceps       for Helicobacter pylori testing. Verification of patient identification       for the specimen was done. Estimated blood loss was minimal.      Piece of corn in gastric pouch Impression:               - Dilation in the entire esophagus.                           - Tortuous esophagus.                            - An adjustable gastric banding was found.                           - Normal examined duodenum.                           - Normal antrum. Biopsied. Moderate Sedation:      Moderate (conscious) sedation was personally administered by an       anesthesia professional. The following parameters were monitored: oxygen       saturation, heart rate, blood pressure, and response to care. Total       physician intraservice time was 15 minutes. Recommendation:           - Discharge patient to home.                           - Resume previous diet.                           - Continue present medications.                           - Await pathology results. Procedure Code(s):        --- Professional ---                           [phone], Esophagogastroduodenoscopy, flexible,  transoral; with biopsy, single or multiple Diagnosis Code(s):        --- Professional ---                           K22.89, Other specified disease of esophagus                           Q39.9, Congenital malformation of esophagus,                            unspecified                           Z98.84, Bariatric surgery status CPT copyright 2022 [employer]. All rights reserved. The codes documented in this report are preliminary and upon coder review may  be revised to meet current compliance requirements. Hyman Hopes Zareah Hunzeker,  02/18/2023 2:00:06 PM This report has been signed electronically. Number of Addenda: 0

## 2023-02-18 NOTE — Transfer of Care (Signed)
Immediate Anesthesia Transfer of Care Note  Patient: [name]  Procedure(s) Performed: ESOPHAGOGASTRODUODENOSCOPY (EGD) BIOPSY  Patient Location: PACU  Anesthesia Type:MAC  Level of Consciousness: sedated  Airway & Oxygen Therapy: Patient Spontanous Breathing and Patient connected to face mask oxygen  Post-op Assessment: Report given to RN and Post -op Vital signs reviewed and stable  Post vital signs: Reviewed and stable  Last Vitals:  Vitals Value Taken Time  BP    Temp    Pulse 74 02/18/23 1358  Resp 18 02/18/23 1358  SpO2 100 % 02/18/23 1358    Last Pain:  Vitals:   02/18/23 1312  TempSrc: Tympanic  PainSc: 0-No pain         Complications: No notable events documented.

## 2023-02-19 LAB — SURGICAL PATHOLOGY

## 2023-02-21 ENCOUNTER — Encounter (HOSPITAL_COMMUNITY): Payer: Self-pay | Admitting: Surgery

## 2023-03-20 ENCOUNTER — Telehealth: Payer: Self-pay | Admitting: Family Medicine

## 2023-03-20 NOTE — Telephone Encounter (Signed)
 Copied from CRM [id number]. Topic: General - Other >> Mar 19, 2023 11:17 AM [name] wrote: Reason for CRM: [name] calling to state she is faxing a medical necessity letter and five year weight history form so insurance will cover her treatment. P# is [phone].  F# [phone]

## 2023-03-21 NOTE — Telephone Encounter (Signed)
Request has not been received.

## 2023-03-27 ENCOUNTER — Telehealth: Payer: Self-pay | Admitting: Family Medicine

## 2023-03-27 DIAGNOSIS — Z9884 Bariatric surgery status: Secondary | ICD-10-CM | POA: Diagnosis not present

## 2023-03-27 NOTE — Telephone Encounter (Signed)
 Copied from CRM [id number]. Topic: General - Other >> Mar 27, 2023  9:27 AM [name] wrote: Reason for CRM: Patient calling in from referral office regarding weight form and medical necessity letter. Informed patient we have not received it per provider note. Office states they faxed it on 02/19 and received a confirmation of receipt for 11:02 AM that same day. Confirmed fax number is correct. Requested it be re-faxed to office.   Please call patient when next fax is received to update her - she cannot move forward with scheduling until these items have been submitted to her provider.  Patient dropped call while attempting to reach out to CAL. CAL non-responsive.

## 2023-04-08 ENCOUNTER — Telehealth: Payer: Self-pay | Admitting: Family Medicine

## 2023-04-08 NOTE — Telephone Encounter (Signed)
 [name] with [facility] called.  She wants to know if we got Medical Necessity form for Bariatric Surgery. Faxes were sent on 2/19 and 2/27. She also sent another fax today. I checked onbase and it is not there. [name] can be reached at [phone].

## 2023-04-09 NOTE — Telephone Encounter (Signed)
 Returned call to [place] @ [facility]. LVM with direct fax # [phone].

## 2023-04-13 ENCOUNTER — Other Ambulatory Visit: Payer: Self-pay | Admitting: Family Medicine

## 2023-04-13 DIAGNOSIS — Z8659 Personal history of other mental and behavioral disorders: Secondary | ICD-10-CM

## 2023-04-16 NOTE — Telephone Encounter (Signed)
 Called patient left vm to call back and schedule appt for weight loss management

## 2023-04-16 NOTE — Telephone Encounter (Signed)
 Pls contact patient to schedule weight loss management appt with Dr. Ashley Royalty. Past due since Feb 2025. Thx

## 2023-05-19 ENCOUNTER — Encounter: Attending: Surgery | Admitting: Skilled Nursing Facility1

## 2023-05-19 VITALS — Wt 231.9 lb

## 2023-05-19 DIAGNOSIS — E669 Obesity, unspecified: Secondary | ICD-10-CM | POA: Insufficient documentation

## 2023-05-20 ENCOUNTER — Encounter: Payer: Self-pay | Admitting: Skilled Nursing Facility1

## 2023-05-20 NOTE — Progress Notes (Signed)
 Pre-Operative Nutrition Class:    Patient was seen on 05/20/2023 for Pre-Operative Bariatric Surgery Education at [facility].    Planned surgery: Band removal, conversation to [facility] Pt expectation of surgery: to get to 160 lbs  NUTRITION ASSESSMENT   Anthropometrics  Start weight at [facility]: 227.0 lbs (date: 01/03/2023)  Weight: 231.9   Clinical   Pharmacotherapy: History of weight loss medication used: phentermine  (not currently)  Medical hx: obesity, vit D, vit C Medications: efferox   Labs: Vit D 27.9; iron saturation 8 Notable signs/symptoms: none noted Any previous deficiencies? No  Samples given per MNT protocol. Patient educated on appropriate usage: ensure Protein  Shake Lot # [id number] Exp: 11/29/2023  The following the learning objectives were met by the patient during this course: Identify Pre-Op Dietary Goals and will begin 2 weeks pre-operatively Identify appropriate sources of fluids and proteins  State protein recommendations and appropriate sources pre and post-operatively Identify Post-Operative Dietary Goals and will follow for 2 weeks post-operatively Identify appropriate multivitamin and calcium sources Describe the need for physical activity post-operatively and will follow MD recommendations State when to call healthcare provider regarding medication questions or post-operative complications When having a diagnosis of diabetes understanding hypoglycemia symptoms and the inclusion of 1 complex carbohydrate per meal  Handouts given during class include: Pre-Op Bariatric Surgery Diet Handout Protein Shake Handout Post-Op Bariatric Surgery Nutrition Handout BELT Program Information Flyer Support Group Information Flyer [facility] Bariatric Supplements Price List  Follow-Up Plan: Patient will follow-up at [facility] 2 weeks post operatively for diet advancement per MD.

## 2023-05-27 ENCOUNTER — Telehealth: Payer: Self-pay

## 2023-05-27 ENCOUNTER — Ambulatory Visit: Payer: Self-pay | Admitting: Surgery

## 2023-05-27 DIAGNOSIS — Z01818 Encounter for other preprocedural examination: Secondary | ICD-10-CM

## 2023-05-27 NOTE — Telephone Encounter (Signed)
 Medication listed does not show Effexor  as XR

## 2023-05-27 NOTE — Telephone Encounter (Signed)
 Copied from CRM [id number]. Topic: Clinical - Medication Question >> May 27, 2023  3:50 PM Corin V wrote: Reason for CRM: Patient is having gastric bypass surgery on 5/6 and was told she should not take extended release medications after the surgery. She stated her is taking effexor  XR and would like to know alternates she can take. A new script can be sent to the [facility] on her chart.

## 2023-05-27 NOTE — Telephone Encounter (Signed)
 Patient informed.

## 2023-05-28 NOTE — Discharge Instructions (Signed)

## 2023-05-29 NOTE — Patient Instructions (Signed)
 SURGICAL WAITING ROOM VISITATION  Patients having surgery or a procedure may have no more than 2 support people in the waiting area - these visitors may rotate.    Children under the age of 40 must have an adult with them who is not the patient.  Due to an increase in RSV and influenza rates and associated hospitalizations, children ages 50 and under may not visit patients in [facility] hospitals.  Visitors with respiratory illnesses are discouraged from visiting and should remain at home.  If the patient needs to stay at the hospital during part of their recovery, the visitor guidelines for inpatient rooms apply. Pre-op nurse will coordinate an appropriate time for 1 support person to accompany patient in pre-op.  This support person may not rotate.    Please refer to the [facility] website for the visitor guidelines for Inpatients (after your surgery is over and you are in a regular room).       Your procedure is scheduled on: 06/03/23   Report to [facility] Main Entrance    Report to admitting at 10:45 AM   Call this number if you have problems the morning of surgery [phone]   Do not eat food :After  6 PM the evening before surgery.   After Midnight you may have the following liquids until  10 AM DAY OF SURGERY  Water Non-Citrus Juices (without pulp, NO RED-Apple, White grape, White cranberry) Black Coffee (NO MILK/CREAM OR CREAMERS, sugar ok)  Clear Tea (NO MILK/CREAM OR CREAMERS, sugar ok) regular and decaf                             Plain Jell-O (NO RED)                                           Fruit ices (not with fruit pulp, NO RED)                                     Popsicles (NO RED)                                                               Sports drinks like Gatorade (NO RED)                  The day of surgery:  Drink ONE (1) Pre-Surgery G2 at 10 AM the morning of surgery. Drink in one sitting. Do not sip.  This drink was given to you during  your hospital  pre-op appointment visit. Nothing else to drink after completing the  Pre-Surgery  G2.   FOLLOW BOWEL PREP AND ANY ADDITIONAL PRE OP INSTRUCTIONS YOU RECEIVED FROM YOUR SURGEON'S OFFICE!!!     Oral Hygiene is also important to reduce your risk of infection.                                    Remember - BRUSH YOUR TEETH THE MORNING OF SURGERY WITH  YOUR REGULAR TOOTHPASTE   Stop all vitamins and herbal supplements 7 days before surgery.   Take these medicines the morning of surgery with A SIP OF WATER: Amoxicillin , Effexor              You may not have any metal on your body including hair pins, jewelry, and body piercing             Do not wear make-up, lotions, powders, perfumes/cologne, or deodorant  Do not wear nail polish including gel and S&S, artificial/acrylic nails, or any other type of covering on natural nails including finger and toenails. If you have artificial nails, gel coating, etc. that needs to be removed by a nail salon please have this removed prior to surgery or surgery may need to be canceled/ delayed if the surgeon/ anesthesia feels like they are unable to be safely monitored.   Do not shave  48 hours prior to surgery.    Do not bring valuables to the hospital. [facility] IS NOT             RESPONSIBLE   FOR VALUABLES.   Contacts, glasses, dentures or bridgework may not be worn into surgery.   Bring small overnight bag day of surgery.   DO NOT BRING YOUR HOME MEDICATIONS TO THE HOSPITAL. PHARMACY WILL DISPENSE MEDICATIONS LISTED ON YOUR MEDICATION LIST TO YOU DURING YOUR ADMISSION IN THE HOSPITAL!    Patients discharged on the day of surgery will not be allowed to drive home.  Someone NEEDS to stay with you for the first 24 hours after anesthesia.   Special Instructions: Bring a copy of your healthcare power of attorney and living will documents the day of surgery if you haven't scanned them before.              Please read over the following  fact sheets you were given: IF YOU HAVE QUESTIONS ABOUT YOUR PRE-OP INSTRUCTIONS PLEASE CALL [phone] Ammon Bales   If you received a COVID test during your pre-op visit  it is requested that you wear a mask when out in public, stay away from anyone that may not be feeling well and notify your surgeon if you develop symptoms. If you test positive for Covid or have been in contact with anyone that has tested positive in the last 10 days please notify you surgeon.    [facility] - Preparing for Surgery Before surgery, you can play an important role.  Because skin is not sterile, your skin needs to be as free of germs as possible.  You can reduce the number of germs on your skin by washing with CHG (chlorahexidine gluconate) soap before surgery.  CHG is an antiseptic cleaner which kills germs and bonds with the skin to continue killing germs even after washing. Please DO NOT use if you have an allergy to CHG or antibacterial soaps.  If your skin becomes reddened/irritated stop using the CHG and inform your nurse when you arrive at Short Stay. Do not shave (including legs and underarms) for at least 48 hours prior to the first CHG shower.  You may shave your face/neck.  Please follow these instructions carefully:  1.  Shower with CHG Soap the night before surgery and the  morning of surgery.  2.  If you choose to wash your hair, wash your hair first as usual with your normal  shampoo.  3.  After you shampoo, rinse your hair and body thoroughly to remove the shampoo.  4.  Use CHG as you would any other liquid soap.  You can apply chg directly to the skin and wash.  Gently with a scrungie or clean washcloth.  5.  Apply the CHG Soap to your body ONLY FROM THE NECK DOWN.   Do   not use on face/ open                           Wound or open sores. Avoid contact with eyes, ears mouth and   genitals (private parts).                       Wash face,  Genitals (private parts) with your  normal soap.             6.  Wash thoroughly, paying special attention to the area where your    surgery  will be performed.  7.  Thoroughly rinse your body with warm water from the neck down.  8.  DO NOT shower/wash with your normal soap after using and rinsing off the CHG Soap.                9.  Pat yourself dry with a clean towel.            10.  Wear clean pajamas.            11.  Place clean sheets on your bed the night of your first shower and do not  sleep with pets. Day of Surgery : Do not apply any lotions/deodorants the morning of surgery.  Please wear clean clothes to the hospital/surgery center.  FAILURE TO FOLLOW THESE INSTRUCTIONS MAY RESULT IN THE CANCELLATION OF YOUR SURGERY    Incentive Spirometer  An incentive spirometer is a tool that can help keep your lungs clear and active. This tool measures how well you are filling your lungs with each breath. Taking long deep breaths may help reverse or decrease the chance of developing breathing (pulmonary) problems (especially infection) following: A long period of time when you are unable to move or be active. BEFORE THE PROCEDURE  If the spirometer includes an indicator to show your best effort, your nurse or respiratory therapist will set it to a desired goal. If possible, sit up straight or lean slightly forward. Try not to slouch. Hold the incentive spirometer in an upright position. INSTRUCTIONS FOR USE  Sit on the edge of your bed if possible, or sit up as far as you can in bed or on a chair. Hold the incentive spirometer in an upright position. Breathe out normally. Place the mouthpiece in your mouth and seal your lips tightly around it. Breathe in slowly and as deeply as possible, raising the piston or the ball toward the top of the column. Hold your breath for 3-5 seconds or for as long as possible. Allow the piston or ball to fall to the bottom of the column. Remove the mouthpiece from your mouth and breathe out  normally. Rest for a few seconds and repeat Steps 1 through 7 at least 10 times every 1-2 hours when you are awake. Take your time and take a few normal breaths between deep breaths. The spirometer may include an indicator to show your best effort. Use the indicator as a goal to work toward during each repetition. After each set of 10 deep breaths, practice coughing to be sure your lungs are clear. If you have  an incision (the cut made at the time of surgery), support your incision when coughing by placing a pillow or rolled up towels firmly against it. Once you are able to get out of bed, walk around indoors and cough well. You may stop using the incentive spirometer when instructed by your caregiver.  RISKS AND COMPLICATIONS Take your time so you do not get dizzy or light-headed. If you are in pain, you may need to take or ask for pain medication before doing incentive spirometry. It is harder to take a deep breath if you are having pain. AFTER USE Rest and breathe slowly and easily. It can be helpful to keep track of a log of your progress. Your caregiver can provide you with a simple table to help with this. If you are using the spirometer at home, follow these instructions: SEEK MEDICAL CARE IF:  You are having difficultly using the spirometer. You have trouble using the spirometer as often as instructed. Your pain medication is not giving enough relief while using the spirometer. You develop fever of 100.5 F (38.1 C) or higher. SEEK IMMEDIATE MEDICAL CARE IF:  You cough up bloody sputum that had not been present before. You develop fever of 102 F (38.9 C) or greater. You develop worsening pain at or near the incision site. MAKE SURE YOU:  Understand these instructions. Will watch your condition. Will get help right away if you are not doing well or get worse.   Aaron AasMORNING OF SURGERY DRINK:   DRINK 1 G2 drink BEFORE YOU LEAVE HOME, DRINK ALL OF THE  G2 DRINK AT ONE TIME.   NO SOLID  FOOD AFTER 600 PM THE NIGHT BEFORE YOUR SURGERY. YOU MAY DRINK CLEAR FLUIDS. THE G2 DRINK YOU DRINK BEFORE YOU LEAVE HOME WILL BE THE LAST FLUIDS YOU DRINK BEFORE SURGERY.  PAIN IS EXPECTED AFTER SURGERY AND WILL NOT BE COMPLETELY ELIMINATED. AMBULATION AND TYLENOL WILL HELP REDUCE INCISIONAL AND GAS PAIN. MOVEMENT IS KEY!  YOU ARE EXPECTED TO BE OUT OF BED WITHIN 4 HOURS OF ADMISSION TO YOUR PATIENT ROOM.  SITTING IN THE RECLINER THROUGHOUT THE DAY IS IMPORTANT FOR DRINKING FLUIDS AND MOVING GAS THROUGHOUT THE GI TRACT.  COMPRESSION STOCKINGS SHOULD BE WORN [facility] STAY UNLESS YOU ARE WALKING.   INCENTIVE SPIROMETER SHOULD BE USED EVERY HOUR WHILE AWAKE TO DECREASE POST-OPERATIVE COMPLICATIONS SUCH AS PNEUMONIA.  WHEN DISCHARGED HOME, IT IS IMPORTANT TO CONTINUE TO WALK EVERY HOUR AND USE THE INCENTIVE SPIROMETER EVERY HOUR.

## 2023-05-29 NOTE — Progress Notes (Signed)
 COVID Vaccine received:  []  No []  Yes Date of any COVID positive Test in last 90 days:  PCP - cody Augustus Ledger DO Cardiologist -   Chest x-ray - 12/13/22 Epic EKG -  11/26/22 Epic Stress Test -  ECHO -  Cardiac Cath -   Cardiac clearance- Charles Connor 12/16/22.  Bowel Prep - []  No  []   Yes ______  Pacemaker / ICD device []  No []  Yes   Spinal Cord Stimulator:[]  No []  Yes       History of Sleep Apnea? []  No []  Yes   CPAP used?- []  No []  Yes    Does the patient monitor blood sugar?          []  No []  Yes  []  N/A  Patient has: []  NO Hx DM   []  Pre-DM                 []  DM1  []   DM2 Does patient have a [employer] or [employer]? []  No []  Yes   Fasting Blood Sugar Ranges-  Checks Blood Sugar _____ times a day  GLP1 agonist / usual dose -  GLP1 instructions:  SGLT-2 inhibitors / usual dose -  SGLT-2 instructions:   Blood Thinner / Instructions: Aspirin Instructions:  Comments:   Activity level: Patient is able / unable to climb a flight of stairs without difficulty; []  No CP  []  No SOB, but would have ___   Patient can / can not perform ADLs without assistance.   Anesthesia review:   Patient denies shortness of breath, fever, cough and chest pain at PAT appointment.  Patient verbalized understanding and agreement to the Pre-Surgical Instructions that were given to them at this PAT appointment. Patient was also educated of the need to review these PAT instructions again prior to his/her surgery.I reviewed the appropriate phone numbers to call if they have any and questions or concerns.

## 2023-05-30 ENCOUNTER — Ambulatory Visit (INDEPENDENT_AMBULATORY_CARE_PROVIDER_SITE_OTHER): Admitting: Family Medicine

## 2023-05-30 ENCOUNTER — Encounter (HOSPITAL_COMMUNITY): Payer: Self-pay | Admitting: Physician Assistant

## 2023-05-30 ENCOUNTER — Encounter: Payer: Self-pay | Admitting: Family Medicine

## 2023-05-30 ENCOUNTER — Encounter (HOSPITAL_COMMUNITY)
Admission: RE | Admit: 2023-05-30 | Discharge: 2023-05-30 | Disposition: A | Discharge status: Home / Self Care | Source: Ambulatory Visit | Attending: Surgery | Admitting: Surgery

## 2023-05-30 ENCOUNTER — Ambulatory Visit: Payer: Self-pay

## 2023-05-30 VITALS — BP 125/51 | HR 99 | Temp 98.6°F | Ht 63.0 in | Wt 222.0 lb

## 2023-05-30 DIAGNOSIS — Z01812 Encounter for preprocedural laboratory examination: Secondary | ICD-10-CM | POA: Diagnosis not present

## 2023-05-30 DIAGNOSIS — R509 Fever, unspecified: Secondary | ICD-10-CM | POA: Diagnosis not present

## 2023-05-30 DIAGNOSIS — Z01818 Encounter for other preprocedural examination: Secondary | ICD-10-CM

## 2023-05-30 DIAGNOSIS — J101 Influenza due to other identified influenza virus with other respiratory manifestations: Secondary | ICD-10-CM

## 2023-05-30 LAB — CBC WITH DIFFERENTIAL/PLATELET
Abs Immature Granulocytes: 0.07 10*3/uL (ref 0.00–0.07)
Basophils Absolute: 0.1 10*3/uL (ref 0.0–0.1)
Basophils Relative: 0 %
Eosinophils Absolute: 0 10*3/uL (ref 0.0–0.5)
Eosinophils Relative: 0 %
HCT: 44.3 % (ref 36.0–46.0)
Hemoglobin: 13.7 g/dL (ref 12.0–15.0)
Immature Granulocytes: 0 %
Lymphocytes Relative: 11 %
Lymphs Abs: 1.8 10*3/uL (ref 0.7–4.0)
MCH: 27.2 pg (ref 26.0–34.0)
MCHC: 30.9 g/dL (ref 30.0–36.0)
MCV: 87.9 fL (ref 80.0–100.0)
Monocytes Absolute: 1.2 10*3/uL — ABNORMAL HIGH (ref 0.1–1.0)
Monocytes Relative: 7 %
Neutro Abs: 13.5 10*3/uL — ABNORMAL HIGH (ref 1.7–7.7)
Neutrophils Relative %: 82 %
Platelets: 235 10*3/uL (ref 150–400)
RBC: 5.04 MIL/uL (ref 3.87–5.11)
RDW: 14.6 % (ref 11.5–15.5)
WBC: 16.6 10*3/uL — ABNORMAL HIGH (ref 4.0–10.5)
nRBC: 0 % (ref 0.0–0.2)

## 2023-05-30 LAB — COMPREHENSIVE METABOLIC PANEL WITH GFR
ALT: 16 U/L (ref 0–44)
AST: 17 U/L (ref 15–41)
Albumin: 3.7 g/dL (ref 3.5–5.0)
Alkaline Phosphatase: 70 U/L (ref 38–126)
Anion gap: 7 (ref 5–15)
BUN: 12 mg/dL (ref 8–23)
CO2: 26 mmol/L (ref 22–32)
Calcium: 9.2 mg/dL (ref 8.9–10.3)
Chloride: 104 mmol/L (ref 98–111)
Creatinine, Ser: 0.7 mg/dL (ref 0.44–1.00)
GFR, Estimated: >60 mL/min (ref >60)
Glucose, Bld: 115 mg/dL — ABNORMAL HIGH (ref 70–99)
Potassium: 4 mmol/L (ref 3.5–5.1)
Sodium: 137 mmol/L (ref 135–145)
Total Bilirubin: 1.9 mg/dL — ABNORMAL HIGH (ref 0.0–1.2)
Total Protein: 7.2 g/dL (ref 6.5–8.1)

## 2023-05-30 LAB — POCT INFLUENZA A/B
Influenza A, POC: NEGATIVE
Influenza B, POC: POSITIVE — AB

## 2023-05-30 LAB — POC COVID19 BINAXNOW: SARS Coronavirus 2 Ag: NEGATIVE

## 2023-05-30 NOTE — Telephone Encounter (Signed)
 Appt scheduled for today 05/30/23 with Dr. Greer Leak

## 2023-05-30 NOTE — Telephone Encounter (Signed)
 Copied from CRM [id number]. Topic: Clinical - Red Word Triage >> May 30, 2023  8:26 AM Eleanore Grey wrote: Red Word that prompted transfer to Nurse Triage: Shelah Derry to have labs done for for surgery, was told she was sick and should be seen by doctor. Has a fever of 102, body aches and chills. Has been feeling bad all week.  Chief Complaint: fever Symptoms: 102, body aches, chills Frequency: Monday Pertinent Negatives: Patient denies congestion Disposition: [] ED /[] Urgent Care (no appt availability in office) / [x] Appointment(In office/virtual)/ []  [facility]/ [] Home Care/ [] Refused Recommended Disposition /[] [facility]/ []  Follow-up with PCP Additional Notes: pt stated has been feeling bad all week; thought it was related to sinuses.  Has been taking sinus medication without relief: everyday after work comes home and lays down due to constant body aches with occasional chills.  Nausea throughout with and eye irritation-left.  Also stated nose feels runny but when blow it nothing comes out.  Reason for Disposition  [1] MODERATE headache (e.g., interferes with normal activities) AND [2] present > 24 hours AND [3] unexplained  (Exceptions: analgesics not tried, typical migraine, or headache part of viral illness)  Fever present > 3 days (72 hours)  Answer Assessment - Initial Assessment Questions 1. TEMPERATURE: "What is the most recent temperature?"  "How was it measured?"      102 2. ONSET: "When did the fever start?"      Monday 3. CHILLS: "Do you have chills?" If yes: "How bad are they?"  (e.g., none, mild, moderate, severe)   - NONE: no chills   - MILD: feeling cold   - MODERATE: feeling very cold, some shivering (feels better under a thick blanket)   - SEVERE: feeling extremely cold with shaking chills (general body shaking, rigors; even under a thick blanket)      Mild to moderate 4. OTHER SYMPTOMS: "Do you have any other symptoms besides the fever?"  (e.g., abdomen pain,  cough, diarrhea, earache, headache, sore throat, urination pain)     Body aches, chills, headache 6/10 - forehead, nausea, eye irritation-left 5. CAUSE: If there are no symptoms, ask: "What do you think is causing the fever?"      N/a 6. CONTACTS: "Does anyone else in the family have an infection?"     N/a 7. TREATMENT: "What have you done so far to treat this fever?" (e.g., medications)     allergy 8. IMMUNOCOMPROMISE: "Do you have of the following: diabetes, HIV positive, splenectomy, cancer chemotherapy, chronic steroid treatment, transplant patient, etc."     N/a 9. PREGNANCY: "Is there any chance you are pregnant?" "When was your last menstrual period?"     N/a 10. TRAVEL: "Have you traveled out of the country in the last month?" (e.g., travel history, exposures)       N/a  Answer Assessment - Initial Assessment Questions 1. LOCATION: "Where does it hurt?"      forehead 2. ONSET: "When did the headache start?" (Minutes, hours or days)      Monday 3. PATTERN: "Does the pain come and go, or has it been constant since it started?"     constant 4. SEVERITY: "How bad is the pain?" and "What does it keep you from doing?"  (e.g., Scale 1-10; mild, moderate, or severe)   - MILD (1-3): doesn't interfere with normal activities    - MODERATE (4-7): interferes with normal activities or awakens from sleep    - SEVERE (8-10): excruciating pain, unable  to do any normal activities        6/10 5. RECURRENT SYMPTOM: "Have you ever had headaches before?" If Yes, ask: "When was the last time?" and "What happened that time?"      Yes - usually related to sinuses 6. CAUSE: "What do you think is causing the headache?"     unknown 7. MIGRAINE: "Have you been diagnosed with migraine headaches?" If Yes, ask: "Is this headache similar?"      no 8. HEAD INJURY: "Has there been any recent injury to the head?"      no 9. OTHER SYMPTOMS: "Do you have any other symptoms?" (fever, stiff neck, eye pain, sore  throat, cold symptoms)     nausea, eye irritation-left 10. PREGNANCY: "Is there any chance you are pregnant?" "When was your last menstrual period?"       no  Protocols used: Fever-A-AH, Headache-A-AH

## 2023-05-30 NOTE — Progress Notes (Signed)
 Acute Office Visit  Subjective:     Patient ID: [name], female    DOB: 09/08/1960, 63 y.o.   MRN: [id number]  Chief Complaint  Patient presents with   Fever    HPI Patient is in today for not feeling well.  She said on Monday she started feeling tired she had a little bit of a scratchy throat and she felt achy she thought maybe it was just allergies.  She started to notice a little bit of a drippy runny nose but when she would blow her nose it was mostly clear she has had a mild cough but no shortness of breath or wheezing and then yesterday she felt like she was running a fever at work.  She went for her preop for surgery for next week this morning.  She says there her fever was 102.2.  And then encouraged that she be evaluated especially before her surgery that scheduled next week.  She says after her preop she went home took some Tylenol and took a nap and when she woke up she was soaking wet.  ROS      Objective:    BP (!) 125/51   Pulse 99   Temp 98.6 F (37 C) (Oral)   Ht 5\' 3"  (1.6 m)   Wt 222 lb (100.7 kg)   SpO2 94%   BMI 39.33 kg/m    Physical Exam Constitutional:      Appearance: Normal appearance.  HENT:     Head: Normocephalic and atraumatic.     Right Ear: Tympanic membrane, ear canal and external ear normal. There is no impacted cerumen.     Left Ear: Tympanic membrane, ear canal and external ear normal. There is no impacted cerumen.     Nose: Nose normal.     Mouth/Throat:     Pharynx: Oropharynx is clear.  Eyes:     Conjunctiva/sclera: Conjunctivae normal.  Cardiovascular:     Rate and Rhythm: Normal rate and regular rhythm.  Pulmonary:     Effort: Pulmonary effort is normal.     Breath sounds: Normal breath sounds.  Musculoskeletal:     Cervical back: Neck supple. No tenderness.  Lymphadenopathy:     Cervical: No cervical adenopathy.  Skin:    General: Skin is warm and dry.  Neurological:     Mental Status: She is alert and oriented  to person, place, and time.  Psychiatric:        Mood and Affect: Mood normal.     Results for orders placed or performed in visit on 05/30/23  POC COVID-19  Result Value Ref Range   SARS Coronavirus 2 Ag Negative Negative  POCT Influenza A/B  Result Value Ref Range   Influenza A, POC Negative Negative   Influenza B, POC Positive (A) Negative  Results for orders placed or performed during the hospital encounter of 05/30/23  CBC with Differential/Platelet  Result Value Ref Range   WBC 16.6 (H) 4.0 - 10.5 K/uL   RBC 5.04 3.87 - 5.11 MIL/uL   Hemoglobin 13.7 12.0 - 15.0 g/dL   HCT 40.9 81.1 - 91.4 %   MCV 87.9 80.0 - 100.0 fL   MCH 27.2 26.0 - 34.0 pg   MCHC 30.9 30.0 - 36.0 g/dL   RDW 78.2 95.6 - 21.3 %   Platelets 235 150 - 400 K/uL   nRBC 0.0 0.0 - 0.2 %   Neutrophils Relative % 82 %   Neutro Abs 13.5 (H)  1.7 - 7.7 K/uL   Lymphocytes Relative 11 %   Lymphs Abs 1.8 0.7 - 4.0 K/uL   Monocytes Relative 7 %   Monocytes Absolute 1.2 (H) 0.1 - 1.0 K/uL   Eosinophils Relative 0 %   Eosinophils Absolute 0.0 0.0 - 0.5 K/uL   Basophils Relative 0 %   Basophils Absolute 0.1 0.0 - 0.1 K/uL   Immature Granulocytes 0 %   Abs Immature Granulocytes 0.07 0.00 - 0.07 K/uL  Comprehensive metabolic panel  Result Value Ref Range   Sodium 137 135 - 145 mmol/L   Potassium 4.0 3.5 - 5.1 mmol/L   Chloride 104 98 - 111 mmol/L   CO2 26 22 - 32 mmol/L   Glucose, Bld 115 (H) 70 - 99 mg/dL   BUN 12 8 - 23 mg/dL   Creatinine, Ser 4.09 0.44 - 1.00 mg/dL   Calcium 9.2 8.9 - 81.1 mg/dL   Total Protein 7.2 6.5 - 8.1 g/dL   Albumin 3.7 3.5 - 5.0 g/dL   AST 17 15 - 41 U/L   ALT 16 0 - 44 U/L   Alkaline Phosphatase 70 38 - 126 U/L   Total Bilirubin 1.9 (H) 0.0 - 1.2 mg/dL   GFR, Estimated >91 >47 mL/min   Anion gap 7 5 - 15  Type and screen  Result Value Ref Range   ABO/RH(D) O POS    Antibody Screen NEG    Sample Expiration 06/13/2023,2359    Extend sample reason      NO TRANSFUSIONS OR  PREGNANCY IN THE PAST 3 MONTHS Performed at [facility], [address] [place], [place] [address]         Assessment & Plan:   Problem List Items Addressed This Visit   None Visit Diagnoses       Fever, unspecified fever cause    -  Primary   Relevant Orders   POC COVID-19 (Completed)   POCT Influenza A/B (Completed)     Influenza B           Positive for influenza B.  Negative for COVID.  We discussed treatment options she is really out of the optimal window for Tamiflu so recommend just symptomatic care at this point she should feel better by Monday and should still be okay for surgery on Tuesday if she is still running a fever Sunday night then please give us  a call on Monday morning.  No orders of the defined types were placed in this encounter.   No follow-ups on file.  Duaine German, MD

## 2023-05-30 NOTE — Progress Notes (Signed)
 Pt. Arrived to PST c/o being "cold", "freezing" Checked  Temp 102.2 Rechecked with same result. She stated she had been feeling nauseated, stuffy nose and tired. HR 92. B/P 144/64. Os sat 100%. Pt's labs drawn and then instructed pt. To go to MD or urgent care to be seen. She verbalized understanding.

## 2023-06-03 ENCOUNTER — Inpatient Hospital Stay (HOSPITAL_COMMUNITY): Admission: RE | Admit: 2023-06-03 | Source: Home / Self Care | Admitting: Surgery

## 2023-06-03 ENCOUNTER — Encounter (HOSPITAL_COMMUNITY): Admission: RE | Payer: Self-pay | Source: Home / Self Care

## 2023-06-03 ENCOUNTER — Telehealth: Payer: Self-pay

## 2023-06-03 LAB — TYPE AND SCREEN
ABO/RH(D): O POS
Antibody Screen: NEGATIVE

## 2023-06-03 SURGERY — CREATION, GASTRIC BYPASS, ROUX-EN-Y, ROBOT-ASSISTED
Anesthesia: General

## 2023-06-03 NOTE — Telephone Encounter (Signed)
 She is out of window for treatment. What ss is she still having?

## 2023-06-03 NOTE — Telephone Encounter (Signed)
 Copied from CRM [id number]. Topic: Clinical - Medication Question >> Jun 03, 2023  4:09 PM [name] wrote: Reason for CRM: Patient states she saw Dr. Greer Leak on Friday and she told patient that she thought she was over the flu but if patient needed TamiFlu, then she would prescribe it. Patient states she is not over the flu and needs the prescription sent in to pharmacy for TamiFlu. Please give patient a call at [phone] to let her know once the prescription has been sent. Pharmacy is [facility] [address] in [place].

## 2023-06-04 MED ORDER — AMOXICILLIN-POT CLAVULANATE 875-125 MG PO TABS: 1.0000 | ORAL_TABLET | Freq: Two times a day (BID) | ORAL | 0 refills | Status: DC

## 2023-06-04 NOTE — Telephone Encounter (Signed)
 Call patient: It sounds like it is probably turning more into a sinus infection.  I am going to go ahead and send over an antibiotic for her to start and see if she is feeling better.  If she is not better after the weekend then please let us  know.  Meds ordered this encounter  Medications   amoxicillin -clavulanate (AUGMENTIN ) 875-125 MG tablet    Sig: Take 1 tablet by mouth 2 (two) times daily.    Dispense:  14 tablet    Refill:  0

## 2023-06-04 NOTE — Telephone Encounter (Signed)
 Contacted the patient and informed her of the provider's note. Verbalized understanding. Patient states that her current symptoms are: coughing, nasal drainage, congestion and  body achiness. She is currently taking cough syrup and Sam's allergy med similar to loratadine.

## 2023-06-05 NOTE — Telephone Encounter (Signed)
 Pt is aware. Voiced her understanding. [name]  [name]

## 2023-07-03 ENCOUNTER — Ambulatory Visit

## 2023-07-03 DIAGNOSIS — R918 Other nonspecific abnormal finding of lung field: Secondary | ICD-10-CM | POA: Diagnosis not present

## 2023-07-03 DIAGNOSIS — I700 Atherosclerosis of aorta: Secondary | ICD-10-CM | POA: Diagnosis not present

## 2023-07-03 DIAGNOSIS — R911 Solitary pulmonary nodule: Secondary | ICD-10-CM

## 2023-07-11 ENCOUNTER — Other Ambulatory Visit: Payer: Self-pay | Admitting: *Deleted

## 2023-07-11 ENCOUNTER — Ambulatory Visit: Payer: Self-pay | Admitting: Cardiology

## 2023-07-11 ENCOUNTER — Encounter (HOSPITAL_COMMUNITY)

## 2023-07-11 DIAGNOSIS — R911 Solitary pulmonary nodule: Secondary | ICD-10-CM

## 2023-07-11 NOTE — Discharge Instructions (Signed)

## 2023-07-16 NOTE — Progress Notes (Signed)
 COVID Vaccine Completed:  Yes  Date of COVID positive in last 90 days: Flu May 2025  PCP - Adela Holter, DO Cardiologist - Alexandria Angel, MD  Cardiac clearance dated 12-16-22, initial surgery canceled due to flu  Chest x-ray - 12-13-22 Epic EKG - 11-26-22 Epic Stress Test - N/A ECHO -  N/A Cardiac Cath -  N/A Pacemaker/ICD device last checked: N/A Spinal Cord Stimulator: N/A Coronary CT - 12-13-22 Epic  Bowel Prep -  N/A  Sleep Study - Yes, -neg sleep apnea CPAP - No  Fasting Blood Sugar -  N/A Checks Blood Sugar _____ times a day  Last dose of GLP1 agonist-  N/A GLP1 instructions:  Do not take after     Last dose of SGLT-2 inhibitors-  N/A SGLT-2 instructions:  Do not take after    Blood Thinner Instructions:  N/A Aspirin Instructions: Last Dose:  Activity level:  Can go up a flight of stairs and perform activities of daily living without stopping and without symptoms of chest pain or shortness of breath.  Able to exercise without symptoms  Anesthesia review: Chest pain and pulmonary nodules followed by cardiology.  Patient denies recent episodes of chest pain (may have been related to esophageal spams).  All flu symptoms resolved.  Patient denies shortness of breath, fever, cough and chest pain at PAT appointment  Patient verbalized understanding of instructions that were given to them at the PAT appointment. Patient was also instructed that they will need to review over the PAT instructions again at home before surgery.

## 2023-07-16 NOTE — Patient Instructions (Signed)
 SURGICAL WAITING ROOM VISITATION Patients having surgery or a procedure may have no more than 2 support people in the waiting area - these visitors may rotate.    Children under the age of 49 must have an adult with them who is not the patient.  If the patient needs to stay at the hospital during part of their recovery, the visitor guidelines for inpatient rooms apply. Pre-op nurse will coordinate an appropriate time for 1 support person to accompany patient in pre-op.  This support person may not rotate.    Please refer to the [facility] website for the visitor guidelines for Inpatients (after your surgery is over and you are in a regular room).       Your procedure is scheduled on: 07-22-23   Report to [facility] Main Entrance    Report to admitting at 5:15 AM   Call this number if you have problems the morning of surgery [phone]   Do not eat food :After 6:00 PM the night before surgery .   After Midnight you may have the following liquids until 4:30 AM DAY OF SURGERY  Water Non-Citrus Juices (without pulp, NO RED-Apple, White grape, White cranberry) Black Coffee (NO MILK/CREAM OR CREAMERS, sugar ok)  Clear Tea (NO MILK/CREAM OR CREAMERS, sugar ok) regular and decaf                             Plain Jell-O (NO RED)                                           Fruit ices (not with fruit pulp, NO RED)                                     Popsicles (NO RED)                                                               Sports drinks like Gatorade (NO RED)                   The day of surgery:  Drink ONE (1) Pre-Surgery G2 by 4:30 AM the morning of surgery. Drink in one sitting. Do not sip.  This drink was given to you during your hospital  pre-op appointment visit. Nothing else to drink after completing the Pre-Surgery G2.          If you have questions, please contact your surgeon's office.   FOLLOW BOWEL PREP AND ANY ADDITIONAL PRE OP INSTRUCTIONS YOU RECEIVED  FROM YOUR SURGEON'S OFFICE!!!     Oral Hygiene is also important to reduce your risk of infection.                                    Remember - BRUSH YOUR TEETH THE MORNING OF SURGERY WITH YOUR REGULAR TOOTHPASTE   Do NOT smoke after Midnight   Take these medicines the morning of surgery with A SIP OF WATER:  Venlafaxine   Stop all vitamins and herbal supplements 7 days before surgery  Bring CPAP mask and tubing day of surgery.                              You may not have any metal on your body including hair pins, jewelry, and body piercing             Do not wear make-up, lotions, powders, perfumes, or deodorant  Do not wear nail polish including gel and S&S, artificial/acrylic nails, or any other type of covering on natural nails including finger and toenails. If you have artificial nails, gel coating, etc. that needs to be removed by a nail salon please have this removed prior to surgery or surgery may need to be canceled/ delayed if the surgeon/ anesthesia feels like they are unable to be safely monitored.   Do not shave  48 hours prior to surgery.    Do not bring valuables to the hospital. [facility] IS NOT RESPONSIBLE   FOR VALUABLES.   Contacts, dentures or bridgework may not be worn into surgery.   Bring small overnight bag day of surgery.   DO NOT BRING YOUR HOME MEDICATIONS TO THE HOSPITAL. PHARMACY WILL DISPENSE MEDICATIONS LISTED ON YOUR MEDICATION LIST TO YOU DURING YOUR ADMISSION IN THE HOSPITAL!     Special Instructions: Bring a copy of your healthcare power of attorney and living will documents the day of surgery if you haven't scanned them before.              Please read over the following fact sheets you were given: IF YOU HAVE QUESTIONS ABOUT YOUR PRE-OP INSTRUCTIONS PLEASE CALL [phone] [name]  If you received a COVID test during your pre-op visit  it is requested that you wear a mask when out in public, stay away from anyone that may not be feeling  well and notify your surgeon if you develop symptoms. If you test positive for Covid or have been in contact with anyone that has tested positive in the last 10 days please notify you surgeon.  [place] - Preparing for Surgery Before surgery, you can play an important role.  Because skin is not sterile, your skin needs to be as free of germs as possible.  You can reduce the number of germs on your skin by washing with CHG (chlorahexidine gluconate) soap before surgery.  CHG is an antiseptic cleaner which kills germs and bonds with the skin to continue killing germs even after washing. Please DO NOT use if you have an allergy to CHG or antibacterial soaps.  If your skin becomes reddened/irritated stop using the CHG and inform your nurse when you arrive at Short Stay. Do not shave (including legs and underarms) for at least 48 hours prior to the first CHG shower.  You may shave your face/neck.  Please follow these instructions carefully:  1.  Shower with CHG Soap the night before surgery and the  morning of surgery.  2.  If you choose to wash your hair, wash your hair first as usual with your normal  shampoo.  3.  After you shampoo, rinse your hair and body thoroughly to remove the shampoo.                             4.  Use CHG as you would any other liquid soap.  You can apply chg directly to the skin and wash.  Gently with a scrungie or clean washcloth.  5.  Apply the CHG Soap to your body ONLY FROM THE NECK DOWN.   Do   not use on face/ open                           Wound or open sores. Avoid contact with eyes, ears mouth and   genitals (private parts).                       Wash face,  Genitals (private parts) with your normal soap.             6.  Wash thoroughly, paying special attention to the area where your    surgery  will be performed.  7.  Thoroughly rinse your body with warm water from the neck down.  8.  DO NOT shower/wash with your normal soap after using and rinsing off the CHG  Soap.                9.  Pat yourself dry with a clean towel.            10.  Wear clean pajamas.            11.  Place clean sheets on your bed the night of your first shower and do not  sleep with pets. Day of Surgery : Do not apply any lotions/deodorants the morning of surgery.  Please wear clean clothes to the hospital/surgery center.  FAILURE TO FOLLOW THESE INSTRUCTIONS MAY RESULT IN THE CANCELLATION OF YOUR SURGERY  PATIENT SIGNATURE_________________________________  NURSE SIGNATURE__________________________________  ________________________________________________________________________    [name]  An incentive spirometer is a tool that can help keep your lungs clear and active. This tool measures how well you are filling your lungs with each breath. Taking long deep breaths may help reverse or decrease the chance of developing breathing (pulmonary) problems (especially infection) following: A long period of time when you are unable to move or be active. BEFORE THE PROCEDURE  If the spirometer includes an indicator to show your best effort, your nurse or respiratory therapist will set it to a desired goal. If possible, sit up straight or lean slightly forward. Try not to slouch. Hold the incentive spirometer in an upright position. INSTRUCTIONS FOR USE  Sit on the edge of your bed if possible, or sit up as far as you can in bed or on a chair. Hold the incentive spirometer in an upright position. Breathe out normally. Place the mouthpiece in your mouth and seal your lips tightly around it. Breathe in slowly and as deeply as possible, raising the piston or the ball toward the top of the column. Hold your breath for 3-5 seconds or for as long as possible. Allow the piston or ball to fall to the bottom of the column. Remove the mouthpiece from your mouth and breathe out normally. Rest for a few seconds and repeat Steps 1 through 7 at least 10 times every 1-2 hours  when you are awake. Take your time and take a few normal breaths between deep breaths. The spirometer may include an indicator to show your best effort. Use the indicator as a goal to work toward during each repetition. After each set of 10 deep breaths, practice coughing to be sure your lungs are clear. If you have an incision (the cut  made at the time of surgery), support your incision when coughing by placing a pillow or rolled up towels firmly against it. Once you are able to get out of bed, walk around indoors and cough well. You may stop using the incentive spirometer when instructed by your caregiver.  RISKS AND COMPLICATIONS Take your time so you do not get dizzy or light-headed. If you are in pain, you may need to take or ask for pain medication before doing incentive spirometry. It is harder to take a deep breath if you are having pain. AFTER USE Rest and breathe slowly and easily. It can be helpful to keep track of a log of your progress. Your caregiver can provide you with a simple table to help with this. If you are using the spirometer at home, follow these instructions: SEEK MEDICAL CARE IF:  You are having difficultly using the spirometer. You have trouble using the spirometer as often as instructed. Your pain medication is not giving enough relief while using the spirometer. You develop fever of 100.5 F (38.1 C) or higher. SEEK IMMEDIATE MEDICAL CARE IF:  You cough up bloody sputum that had not been present before. You develop fever of 102 F (38.9 C) or greater. You develop worsening pain at or near the incision site. MAKE SURE YOU:  Understand these instructions. Will watch your condition. Will get help right away if you are not doing well or get worse. Document Released: 05/27/2006 Document Revised: 04/08/2011 Document Reviewed: 07/28/2006 Exit[employer] Patient Information 2014 Exit[employer],  [place].   ________________________________________________________________________ WHAT IS A BLOOD TRANSFUSION? Blood Transfusion Information  A transfusion is the replacement of blood or some of its parts. Blood is made up of multiple cells which provide different functions. Red blood cells carry oxygen and are used for blood loss replacement. White blood cells fight against infection. Platelets control bleeding. Plasma helps clot blood. Other blood products are available for specialized needs, such as hemophilia or other clotting disorders. BEFORE THE TRANSFUSION  Who gives blood for transfusions?  Healthy volunteers who are fully evaluated to make sure their blood is safe. This is blood bank blood. Transfusion therapy is the safest it has ever been in the practice of medicine. Before blood is taken from a donor, a complete history is taken to make sure that person has no history of diseases nor engages in risky social behavior (examples are intravenous drug use or sexual activity with multiple partners). The donor's travel history is screened to minimize risk of transmitting infections, such as malaria. The donated blood is tested for signs of infectious diseases, such as HIV and hepatitis. The blood is then tested to be sure it is compatible with you in order to minimize the chance of a transfusion reaction. If you or a relative donates blood, this is often done in anticipation of surgery and is not appropriate for emergency situations. It takes many days to process the donated blood. RISKS AND COMPLICATIONS Although transfusion therapy is very safe and saves many lives, the main dangers of transfusion include:  Getting an infectious disease. Developing a transfusion reaction. This is an allergic reaction to something in the blood you were given. Every precaution is taken to prevent this. The decision to have a blood transfusion has been considered carefully by your caregiver before blood is  given. Blood is not given unless the benefits outweigh the risks. AFTER THE TRANSFUSION Right after receiving a blood transfusion, you will usually feel much better and more energetic. This is  especially true if your red blood cells have gotten low (anemic). The transfusion raises the level of the red blood cells which carry oxygen, and this usually causes an energy increase. The nurse administering the transfusion will monitor you carefully for complications. HOME CARE INSTRUCTIONS  No special instructions are needed after a transfusion. You may find your energy is better. Speak with your caregiver about any limitations on activity for underlying diseases you may have. SEEK MEDICAL CARE IF:  Your condition is not improving after your transfusion. You develop redness or irritation at the intravenous (IV) site. SEEK IMMEDIATE MEDICAL CARE IF:  Any of the following symptoms occur over the next 12 hours: Shaking chills. You have a temperature by mouth above 102 F (38.9 C), not controlled by medicine. Chest, back, or muscle pain. People around you feel you are not acting correctly or are confused. Shortness of breath or difficulty breathing. Dizziness and fainting. You get a rash or develop hives. You have a decrease in urine output. Your urine turns a dark color or changes to pink, red, or brown. Any of the following symptoms occur over the next 10 days: You have a temperature by mouth above 102 F (38.9 C), not controlled by medicine. Shortness of breath. Weakness after normal activity. The white part of the eye turns yellow (jaundice). You have a decrease in the amount of urine or are urinating less often. Your urine turns a dark color or changes to pink, red, or brown. Document Released: 01/12/2000 Document Revised: 04/08/2011 Document Reviewed: 08/31/2007 [facility] Patient Information 2014 [place], [place].  _______________________________________________________________________

## 2023-07-18 ENCOUNTER — Encounter (HOSPITAL_COMMUNITY): Payer: Self-pay

## 2023-07-18 ENCOUNTER — Other Ambulatory Visit: Payer: Self-pay

## 2023-07-18 ENCOUNTER — Encounter (HOSPITAL_COMMUNITY)
Admission: RE | Admit: 2023-07-18 | Discharge: 2023-07-18 | Disposition: A | Discharge status: Home / Self Care | Source: Ambulatory Visit | Attending: Surgery | Admitting: Surgery

## 2023-07-18 VITALS — BP 138/74 | HR 69 | Temp 98.5°F | Resp 16 | Ht 63.5 in | Wt 224.6 lb

## 2023-07-18 DIAGNOSIS — Z01812 Encounter for preprocedural laboratory examination: Secondary | ICD-10-CM | POA: Diagnosis not present

## 2023-07-18 DIAGNOSIS — Z01818 Encounter for other preprocedural examination: Secondary | ICD-10-CM

## 2023-07-18 HISTORY — DX: Other specified postprocedural states: Z98.890

## 2023-07-18 HISTORY — DX: Other specified postprocedural states: R11.2

## 2023-07-18 LAB — CBC
HCT: 43.3 % (ref 36.0–46.0)
Hemoglobin: 13.2 g/dL (ref 12.0–15.0)
MCH: 27 pg (ref 26.0–34.0)
MCHC: 30.5 g/dL (ref 30.0–36.0)
MCV: 88.5 fL (ref 80.0–100.0)
Platelets: 246 10*3/uL (ref 150–400)
RBC: 4.89 MIL/uL (ref 3.87–5.11)
RDW: 14.4 % (ref 11.5–15.5)
WBC: 5.6 10*3/uL (ref 4.0–10.5)
nRBC: 0 % (ref 0.0–0.2)

## 2023-07-20 ENCOUNTER — Other Ambulatory Visit: Payer: Self-pay | Admitting: Family Medicine

## 2023-07-20 DIAGNOSIS — Z8659 Personal history of other mental and behavioral disorders: Secondary | ICD-10-CM

## 2023-07-21 NOTE — Anesthesia Preprocedure Evaluation (Addendum)
 Anesthesia Evaluation  Patient identified by MRN, date of birth, ID band Patient awake    Reviewed: Allergy & Precautions, NPO status , Patient's Chart, lab work & pertinent test results  History of Anesthesia Complications (+) PONV and history of anesthetic complications  Airway Mallampati: II  TM Distance: >3 FB Neck ROM: Full    Dental no notable dental hx. (+) Teeth Intact, Dental Advisory Given   Pulmonary former smoker   Pulmonary exam normal breath sounds clear to auscultation       Cardiovascular (-) hypertension(-) angina (-) Past MI Normal cardiovascular exam Rhythm:Regular Rate:Normal     Neuro/Psych   Anxiety     negative neurological ROS  negative psych ROS   GI/Hepatic negative GI ROS, Neg liver ROS,,,  Endo/Other    Renal/GU Lab Results      Component                Value               Date                            K                        4.0                 05/30/2023                   CREATININE               0.70                05/30/2023                GFRNONAA                 >60                 05/30/2023                    Musculoskeletal negative musculoskeletal ROS (+)    Abdominal  (+) + obese  Peds  Hematology negative hematology ROS (+) Lab Results      Component                Value               Date                      WBC                      5.6                 07/18/2023                HGB                      13.2                07/18/2023                HCT                      43.3                07/18/2023  PLT                      246                 07/18/2023              Anesthesia Other Findings   Reproductive/Obstetrics                             Anesthesia Physical Anesthesia Plan  ASA: 3  Anesthesia Plan: General   Post-op Pain Management: Ofirmev IV (intra-op)*, Toradol IV (intra-op)*, Lidocaine  infusion* and  Ketamine IV*   Induction: Intravenous  PONV Risk Score and Plan: 4 or greater and Treatment may vary due to age or medical condition, Midazolam, Ondansetron, Scopolamine patch - Pre-op and Dexamethasone  Airway Management Planned: Oral ETT  Additional Equipment: None  Intra-op Plan:   Post-operative Plan: Extubation in OR  Informed Consent: I have reviewed the patients History and Physical, chart, labs and discussed the procedure including the risks, benefits and alternatives for the proposed anesthesia with the patient or authorized representative who has indicated his/her understanding and acceptance.     Dental advisory given  Plan Discussed with: CRNA and Surgeon  Anesthesia Plan Comments:        Anesthesia Quick Evaluation

## 2023-07-22 ENCOUNTER — Encounter (HOSPITAL_COMMUNITY): Payer: Self-pay | Admitting: Surgery

## 2023-07-22 ENCOUNTER — Other Ambulatory Visit: Payer: Self-pay

## 2023-07-22 ENCOUNTER — Encounter (HOSPITAL_COMMUNITY)
Admission: RE | Disposition: A | Discharge status: Home / Self Care | Payer: Self-pay | Source: Home / Self Care | Attending: Surgery

## 2023-07-22 ENCOUNTER — Inpatient Hospital Stay (HOSPITAL_COMMUNITY)
Admission: RE | Admit: 2023-07-22 | Discharge: 2023-07-23 | DRG: 326 | Disposition: A | Discharge status: Home / Self Care | Attending: Surgery | Admitting: Surgery

## 2023-07-22 ENCOUNTER — Inpatient Hospital Stay (HOSPITAL_COMMUNITY): Payer: Self-pay | Admitting: Anesthesiology

## 2023-07-22 ENCOUNTER — Inpatient Hospital Stay (HOSPITAL_COMMUNITY): Payer: Self-pay | Admitting: Physician Assistant

## 2023-07-22 DIAGNOSIS — Z90710 Acquired absence of both cervix and uterus: Secondary | ICD-10-CM | POA: Diagnosis not present

## 2023-07-22 DIAGNOSIS — K2289 Other specified disease of esophagus: Secondary | ICD-10-CM | POA: Diagnosis present

## 2023-07-22 DIAGNOSIS — K9509 Other complications of gastric band procedure: Secondary | ICD-10-CM | POA: Diagnosis not present

## 2023-07-22 DIAGNOSIS — Z6841 Body Mass Index (BMI) 40.0 and over, adult: Secondary | ICD-10-CM | POA: Diagnosis not present

## 2023-07-22 DIAGNOSIS — Y831 Surgical operation with implant of artificial internal device as the cause of abnormal reaction of the patient, or of later complication, without mention of misadventure at the time of the procedure: Secondary | ICD-10-CM | POA: Diagnosis not present

## 2023-07-22 DIAGNOSIS — Z8249 Family history of ischemic heart disease and other diseases of the circulatory system: Secondary | ICD-10-CM

## 2023-07-22 DIAGNOSIS — K219 Gastro-esophageal reflux disease without esophagitis: Secondary | ICD-10-CM | POA: Diagnosis not present

## 2023-07-22 DIAGNOSIS — Z98891 History of uterine scar from previous surgery: Secondary | ICD-10-CM

## 2023-07-22 DIAGNOSIS — M8580 Other specified disorders of bone density and structure, unspecified site: Secondary | ICD-10-CM | POA: Diagnosis not present

## 2023-07-22 DIAGNOSIS — Y738 Miscellaneous gastroenterology and urology devices associated with adverse incidents, not elsewhere classified: Secondary | ICD-10-CM | POA: Diagnosis present

## 2023-07-22 DIAGNOSIS — Z87891 Personal history of nicotine dependence: Secondary | ICD-10-CM

## 2023-07-22 DIAGNOSIS — Z800 Family history of malignant neoplasm of digestive organs: Secondary | ICD-10-CM

## 2023-07-22 DIAGNOSIS — Q393 Congenital stenosis and stricture of esophagus: Secondary | ICD-10-CM

## 2023-07-22 DIAGNOSIS — Z9884 Bariatric surgery status: Secondary | ICD-10-CM | POA: Diagnosis not present

## 2023-07-22 DIAGNOSIS — Z79899 Other long term (current) drug therapy: Secondary | ICD-10-CM | POA: Diagnosis not present

## 2023-07-22 DIAGNOSIS — Z9889 Other specified postprocedural states: Secondary | ICD-10-CM

## 2023-07-22 DIAGNOSIS — Z01818 Encounter for other preprocedural examination: Secondary | ICD-10-CM

## 2023-07-22 DIAGNOSIS — K2100 Gastro-esophageal reflux disease with esophagitis, without bleeding: Secondary | ICD-10-CM | POA: Diagnosis not present

## 2023-07-22 DIAGNOSIS — K224 Dyskinesia of esophagus: Secondary | ICD-10-CM | POA: Diagnosis not present

## 2023-07-22 DIAGNOSIS — E669 Obesity, unspecified: Secondary | ICD-10-CM | POA: Diagnosis not present

## 2023-07-22 DIAGNOSIS — K3189 Other diseases of stomach and duodenum: Secondary | ICD-10-CM | POA: Diagnosis not present

## 2023-07-22 DIAGNOSIS — Z803 Family history of malignant neoplasm of breast: Secondary | ICD-10-CM

## 2023-07-22 DIAGNOSIS — Z833 Family history of diabetes mellitus: Secondary | ICD-10-CM

## 2023-07-22 HISTORY — PX: UPPER GI ENDOSCOPY: SHX6162

## 2023-07-22 HISTORY — PX: CREATION, GASTRIC BYPASS, ROUX-EN-Y, ROBOT-ASSISTED: SHX7545

## 2023-07-22 LAB — CBC
HCT: 47.2 % — ABNORMAL HIGH (ref 36.0–46.0)
Hemoglobin: 14.5 g/dL (ref 12.0–15.0)
MCH: 27.1 pg (ref 26.0–34.0)
MCHC: 30.7 g/dL (ref 30.0–36.0)
MCV: 88.2 fL (ref 80.0–100.0)
Platelets: 235 10*3/uL (ref 150–400)
RBC: 5.35 MIL/uL — ABNORMAL HIGH (ref 3.87–5.11)
RDW: 14.6 % (ref 11.5–15.5)
WBC: 12.2 10*3/uL — ABNORMAL HIGH (ref 4.0–10.5)
nRBC: 0 % (ref 0.0–0.2)

## 2023-07-22 LAB — TYPE AND SCREEN
ABO/RH(D): O POS
Antibody Screen: NEGATIVE

## 2023-07-22 SURGERY — CREATION, GASTRIC BYPASS, ROUX-EN-Y, ROBOT-ASSISTED
Anesthesia: General

## 2023-07-22 MED ORDER — ACETAMINOPHEN 10 MG/ML IV SOLN: 1000.0000 mg | Freq: Once | INTRAVENOUS | Status: DC | PRN

## 2023-07-22 MED ORDER — BUPIVACAINE-EPINEPHRINE (PF) 0.25% -1:200000 IJ SOLN
INTRAMUSCULAR | Status: AC
  Filled 2023-07-22: qty 30

## 2023-07-22 MED ORDER — PROPOFOL 10 MG/ML IV BOLUS
INTRAVENOUS | Status: AC
  Filled 2023-07-22: qty 20

## 2023-07-22 MED ORDER — KETAMINE HCL 50 MG/5ML IJ SOSY
PREFILLED_SYRINGE | INTRAMUSCULAR | Status: AC
  Filled 2023-07-22: qty 5

## 2023-07-22 MED ORDER — BUPIVACAINE LIPOSOME 1.3 % IJ SUSP
INTRAMUSCULAR | Status: AC
  Filled 2023-07-22: qty 20

## 2023-07-22 MED ORDER — PHENYLEPHRINE 80 MCG/ML (10ML) SYRINGE FOR IV PUSH (FOR BLOOD PRESSURE SUPPORT)
PREFILLED_SYRINGE | INTRAVENOUS | Status: AC
  Filled 2023-07-22: qty 10

## 2023-07-22 MED ORDER — CHLORHEXIDINE GLUCONATE CLOTH 2 % EX PADS: 6.0000 | MEDICATED_PAD | Freq: Once | CUTANEOUS | Status: DC

## 2023-07-22 MED ORDER — ENSURE MAX PROTEIN PO LIQD
2.0000 [oz_av] | ORAL | Status: DC
  Administered 2023-07-23 (×2): 2 [oz_av] via ORAL

## 2023-07-22 MED ORDER — EPHEDRINE 5 MG/ML INJ
INTRAVENOUS | Status: AC
  Filled 2023-07-22: qty 5

## 2023-07-22 MED ORDER — HYDROMORPHONE HCL 1 MG/ML IJ SOLN
INTRAMUSCULAR | Status: AC
  Filled 2023-07-22: qty 1

## 2023-07-22 MED ORDER — OXYCODONE HCL 5 MG PO TABS: 5.0000 mg | ORAL_TABLET | Freq: Once | ORAL | Status: DC | PRN

## 2023-07-22 MED ORDER — VENLAFAXINE HCL 75 MG PO TABS
75.0000 mg | ORAL_TABLET | Freq: Two times a day (BID) | ORAL | Status: DC
  Administered 2023-07-23: 75 mg via ORAL
  Filled 2023-07-22 (×2): qty 1

## 2023-07-22 MED ORDER — DEXAMETHASONE SODIUM PHOSPHATE 10 MG/ML IJ SOLN
INTRAMUSCULAR | Status: DC | PRN
  Administered 2023-07-22: 4 mg via INTRAVENOUS

## 2023-07-22 MED ORDER — LACTATED RINGERS IV SOLN: INTRAVENOUS | Status: DC

## 2023-07-22 MED ORDER — MIDAZOLAM HCL 2 MG/2ML IJ SOLN
INTRAMUSCULAR | Status: DC | PRN
  Administered 2023-07-22: 2 mg via INTRAVENOUS

## 2023-07-22 MED ORDER — ROCURONIUM BROMIDE 10 MG/ML (PF) SYRINGE
PREFILLED_SYRINGE | INTRAVENOUS | Status: AC
Start: 2023-07-22 — End: 2023-07-22
  Filled 2023-07-22: qty 10

## 2023-07-22 MED ORDER — ACETAMINOPHEN 160 MG/5ML PO SOLN: 1000.0000 mg | Freq: Three times a day (TID) | ORAL | Status: DC

## 2023-07-22 MED ORDER — SUGAMMADEX SODIUM 200 MG/2ML IV SOLN
INTRAVENOUS | Status: DC | PRN
Start: 2023-07-22 — End: 2023-07-22
  Administered 2023-07-22: 210 mg via INTRAVENOUS

## 2023-07-22 MED ORDER — ACETAMINOPHEN 500 MG PO TABS
1000.0000 mg | ORAL_TABLET | ORAL | Status: AC
  Administered 2023-07-22: 1000 mg via ORAL
  Filled 2023-07-22: qty 2

## 2023-07-22 MED ORDER — FENTANYL CITRATE (PF) 100 MCG/2ML IJ SOLN
INTRAMUSCULAR | Status: DC | PRN
  Administered 2023-07-22: 100 ug via INTRAVENOUS
  Administered 2023-07-22: 50 ug via INTRAVENOUS

## 2023-07-22 MED ORDER — ACETAMINOPHEN 500 MG PO TABS
1000.0000 mg | ORAL_TABLET | Freq: Three times a day (TID) | ORAL | Status: DC
  Administered 2023-07-22 – 2023-07-23 (×3): 1000 mg via ORAL
  Filled 2023-07-22 (×3): qty 2

## 2023-07-22 MED ORDER — APREPITANT 40 MG PO CAPS
40.0000 mg | ORAL_CAPSULE | ORAL | Status: AC
  Administered 2023-07-22: 40 mg via ORAL
  Filled 2023-07-22: qty 1

## 2023-07-22 MED ORDER — PROCHLORPERAZINE EDISYLATE 10 MG/2ML IJ SOLN
10.0000 mg | Freq: Four times a day (QID) | INTRAMUSCULAR | Status: DC | PRN
  Administered 2023-07-22: 10 mg via INTRAVENOUS
  Filled 2023-07-22: qty 2

## 2023-07-22 MED ORDER — FENTANYL CITRATE (PF) 100 MCG/2ML IJ SOLN
INTRAMUSCULAR | Status: AC
Start: 2023-07-22 — End: 2023-07-22
  Filled 2023-07-22: qty 2

## 2023-07-22 MED ORDER — ONDANSETRON HCL 4 MG/2ML IJ SOLN: 4.0000 mg | Freq: Once | INTRAMUSCULAR | Status: DC | PRN

## 2023-07-22 MED ORDER — PHENYLEPHRINE 80 MCG/ML (10ML) SYRINGE FOR IV PUSH (FOR BLOOD PRESSURE SUPPORT)
PREFILLED_SYRINGE | INTRAVENOUS | Status: DC | PRN
  Administered 2023-07-22 (×2): 160 ug via INTRAVENOUS
  Administered 2023-07-22 (×6): 80 ug via INTRAVENOUS

## 2023-07-22 MED ORDER — HYDROMORPHONE HCL 1 MG/ML IJ SOLN
0.2500 mg | INTRAMUSCULAR | Status: DC | PRN
  Administered 2023-07-22: 0.5 mg via INTRAVENOUS

## 2023-07-22 MED ORDER — PANTOPRAZOLE SODIUM 40 MG IV SOLR
40.0000 mg | Freq: Every day | INTRAVENOUS | Status: DC
  Administered 2023-07-22: 40 mg via INTRAVENOUS
  Filled 2023-07-22: qty 10

## 2023-07-22 MED ORDER — PROPOFOL 10 MG/ML IV BOLUS
INTRAVENOUS | Status: DC | PRN
Start: 2023-07-22 — End: 2023-07-22
  Administered 2023-07-22: 180 mg via INTRAVENOUS

## 2023-07-22 MED ORDER — LIDOCAINE HCL (PF) 2 % IJ SOLN
INTRAMUSCULAR | Status: AC
  Filled 2023-07-22: qty 5

## 2023-07-22 MED ORDER — ROCURONIUM BROMIDE 10 MG/ML (PF) SYRINGE
PREFILLED_SYRINGE | INTRAVENOUS | Status: DC | PRN
  Administered 2023-07-22: 10 mg via INTRAVENOUS
  Administered 2023-07-22: 70 mg via INTRAVENOUS
  Administered 2023-07-22: 30 mg via INTRAVENOUS
  Administered 2023-07-22: 20 mg via INTRAVENOUS
  Administered 2023-07-22: 10 mg via INTRAVENOUS

## 2023-07-22 MED ORDER — ONDANSETRON HCL 4 MG/2ML IJ SOLN
INTRAMUSCULAR | Status: AC
  Filled 2023-07-22: qty 2

## 2023-07-22 MED ORDER — OXYCODONE HCL 5 MG/5ML PO SOLN: 5.0000 mg | Freq: Once | ORAL | Status: DC | PRN

## 2023-07-22 MED ORDER — AMISULPRIDE (ANTIEMETIC) 5 MG/2ML IV SOLN
INTRAVENOUS | Status: AC
  Filled 2023-07-22: qty 2

## 2023-07-22 MED ORDER — 0.9 % SODIUM CHLORIDE (POUR BTL) OPTIME
TOPICAL | Status: DC | PRN
  Administered 2023-07-22: 1000 mL

## 2023-07-22 MED ORDER — EPHEDRINE SULFATE-NACL 50-0.9 MG/10ML-% IV SOSY
PREFILLED_SYRINGE | INTRAVENOUS | Status: DC | PRN
  Administered 2023-07-22 (×2): 10 mg via INTRAVENOUS
  Administered 2023-07-22: 5 mg via INTRAVENOUS

## 2023-07-22 MED ORDER — ONDANSETRON HCL 4 MG/2ML IJ SOLN
4.0000 mg | INTRAMUSCULAR | Status: DC | PRN
  Administered 2023-07-22 – 2023-07-23 (×3): 4 mg via INTRAVENOUS
  Filled 2023-07-22 (×3): qty 2

## 2023-07-22 MED ORDER — MIDAZOLAM HCL 2 MG/2ML IJ SOLN
INTRAMUSCULAR | Status: AC
  Filled 2023-07-22: qty 2

## 2023-07-22 MED ORDER — DEXAMETHASONE SODIUM PHOSPHATE 10 MG/ML IJ SOLN
INTRAMUSCULAR | Status: AC
  Filled 2023-07-22: qty 1

## 2023-07-22 MED ORDER — ENOXAPARIN (LOVENOX) PATIENT EDUCATION KIT
PACK | Freq: Once | Status: AC
  Filled 2023-07-22: qty 1

## 2023-07-22 MED ORDER — SCOPOLAMINE 1 MG/3DAYS TD PT72
1.0000 | MEDICATED_PATCH | TRANSDERMAL | Status: DC
  Administered 2023-07-22: 1.5 mg via TRANSDERMAL
  Filled 2023-07-22: qty 1

## 2023-07-22 MED ORDER — ORAL CARE MOUTH RINSE: 15.0000 mL | Freq: Once | OROMUCOSAL | Status: AC

## 2023-07-22 MED ORDER — OXYCODONE HCL 5 MG/5ML PO SOLN
5.0000 mg | Freq: Four times a day (QID) | ORAL | Status: DC | PRN
  Administered 2023-07-22 (×2): 5 mg via ORAL
  Filled 2023-07-22 (×2): qty 5

## 2023-07-22 MED ORDER — SODIUM CHLORIDE 0.9 % IV SOLN
2.0000 g | INTRAVENOUS | Status: AC
  Administered 2023-07-22: 2 g via INTRAVENOUS
  Filled 2023-07-22: qty 2

## 2023-07-22 MED ORDER — LIDOCAINE HCL (CARDIAC) PF 100 MG/5ML IV SOSY
PREFILLED_SYRINGE | INTRAVENOUS | Status: DC | PRN
  Administered 2023-07-22: 100 mg via INTRAVENOUS

## 2023-07-22 MED ORDER — BUPIVACAINE-EPINEPHRINE 0.25% -1:200000 IJ SOLN
INTRAMUSCULAR | Status: DC | PRN
  Administered 2023-07-22 (×2): 30 mL

## 2023-07-22 MED ORDER — STERILE WATER FOR IRRIGATION IR SOLN
Status: DC | PRN
  Administered 2023-07-22: 1000 mL

## 2023-07-22 MED ORDER — HEPARIN SODIUM (PORCINE) 5000 UNIT/ML IJ SOLN
5000.0000 [IU] | INTRAMUSCULAR | Status: AC
  Administered 2023-07-22: 5000 [IU] via SUBCUTANEOUS
  Filled 2023-07-22: qty 1

## 2023-07-22 MED ORDER — MORPHINE SULFATE (PF) 2 MG/ML IV SOLN: 1.0000 mg | INTRAVENOUS | Status: DC | PRN

## 2023-07-22 MED ORDER — KETAMINE HCL 50 MG/5ML IJ SOSY
PREFILLED_SYRINGE | INTRAMUSCULAR | Status: DC | PRN
  Administered 2023-07-22 (×2): 10 mg via INTRAVENOUS

## 2023-07-22 MED ORDER — ONDANSETRON HCL 4 MG/2ML IJ SOLN
INTRAMUSCULAR | Status: DC | PRN
  Administered 2023-07-22: 4 mg via INTRAVENOUS

## 2023-07-22 MED ORDER — ROCURONIUM BROMIDE 10 MG/ML (PF) SYRINGE
PREFILLED_SYRINGE | INTRAVENOUS | Status: AC
  Filled 2023-07-22: qty 10

## 2023-07-22 MED ORDER — HEPARIN SODIUM (PORCINE) 5000 UNIT/ML IJ SOLN
5000.0000 [IU] | Freq: Three times a day (TID) | INTRAMUSCULAR | Status: DC
  Administered 2023-07-22 – 2023-07-23 (×2): 5000 [IU] via SUBCUTANEOUS
  Filled 2023-07-22 (×2): qty 1

## 2023-07-22 MED ORDER — CHLORHEXIDINE GLUCONATE 0.12 % MT SOLN
15.0000 mL | Freq: Once | OROMUCOSAL | Status: AC
  Administered 2023-07-22: 15 mL via OROMUCOSAL

## 2023-07-22 MED ORDER — AMISULPRIDE (ANTIEMETIC) 5 MG/2ML IV SOLN
10.0000 mg | Freq: Once | INTRAVENOUS | Status: AC | PRN
  Administered 2023-07-22: 10 mg via INTRAVENOUS

## 2023-07-22 SURGICAL SUPPLY — 57 items
BLADE SURG SZ11 CARB STEEL (BLADE) ×2 IMPLANT
CHLORAPREP W/TINT 26 (MISCELLANEOUS) ×4 IMPLANT
COVER SURGICAL LIGHT HANDLE (MISCELLANEOUS) ×2 IMPLANT
COVER TIP SHEARS 8 DVNC (MISCELLANEOUS) ×2 IMPLANT
DERMABOND ADVANCED .7 DNX12 (GAUZE/BANDAGES/DRESSINGS) ×2 IMPLANT
DRAPE ARM DVNC X/XI (DISPOSABLE) ×8 IMPLANT
DRAPE COLUMN DVNC XI (DISPOSABLE) ×2 IMPLANT
DRIVER NDL LRG 8 DVNC XI (INSTRUMENTS) ×2 IMPLANT
DRIVER NDL MEGA SUTCUT DVNCXI (INSTRUMENTS) ×2 IMPLANT
DRIVER NDLE LRG 8 DVNC XI (INSTRUMENTS) IMPLANT
DRIVER NDLE MEGA SUTCUT DVNCXI (INSTRUMENTS) ×1 IMPLANT
ELECT PENCIL ROCKER SW 15FT (MISCELLANEOUS) IMPLANT
ELECT REM PT RETURN 15FT ADLT (MISCELLANEOUS) ×2 IMPLANT
FORCEPS CADIERE DVNC XI (FORCEP) ×2 IMPLANT
GAUZE 4X4 16PLY ~~LOC~~+RFID DBL (SPONGE) ×2 IMPLANT
GLOVE BIO SURGEON STRL SZ7.5 (GLOVE) ×4 IMPLANT
GLOVE INDICATOR 8.0 STRL GRN (GLOVE) ×4 IMPLANT
GOWN STRL REUS W/ TWL XL LVL3 (GOWN DISPOSABLE) ×4 IMPLANT
GRASPER SUT TROCAR 14GX15 (MISCELLANEOUS) ×2 IMPLANT
GRASPER TIP-UP FEN DVNC XI (INSTRUMENTS) ×2 IMPLANT
IRRIGATION SUCT STRKRFLW 2 WTP (MISCELLANEOUS) IMPLANT
IRRIGATOR SUCT 8 DISP DVNC XI (IRRIGATION / IRRIGATOR) IMPLANT
KIT BASIN OR (CUSTOM PROCEDURE TRAY) ×2 IMPLANT
KIT GASTRIC LAVAGE 34FR ADT (SET/KITS/TRAYS/PACK) ×2 IMPLANT
KIT TURNOVER KIT A (KITS) ×2 IMPLANT
LUBRICANT JELLY K Y 4OZ (MISCELLANEOUS) IMPLANT
MARKER SKIN DUAL TIP RULER LAB (MISCELLANEOUS) ×2 IMPLANT
MAT PREVALON FULL STRYKER (MISCELLANEOUS) ×2 IMPLANT
NDL SPNL 18GX3.5 QUINCKE PK (NEEDLE) ×2 IMPLANT
NEEDLE SPNL 18GX3.5 QUINCKE PK (NEEDLE) ×1 IMPLANT
OBTURATOR OPTICALSTD 8 DVNC (TROCAR) ×2 IMPLANT
PACK CARDIOVASCULAR III (CUSTOM PROCEDURE TRAY) ×2 IMPLANT
RELOAD STAPLE 60 2.5 WHT DVNC (STAPLE) ×6 IMPLANT
RELOAD STAPLE 60 3.5 BLU DVNC (STAPLE) IMPLANT
RELOAD STAPLER 2.5X60 WHT DVNC (STAPLE) ×2 IMPLANT
RELOAD STAPLER 3.5X60 BLU DVNC (STAPLE) ×5 IMPLANT
SCISSORS LAP 5X35 DISP (ENDOMECHANICALS) IMPLANT
SCISSORS MNPLR CVD DVNC XI (INSTRUMENTS) ×2 IMPLANT
SEAL UNIV 5-12 XI (MISCELLANEOUS) ×8 IMPLANT
SEALER VESSEL EXT DVNC XI (MISCELLANEOUS) ×2 IMPLANT
SET TUBE SMOKE EVAC HIGH FLOW (TUBING) ×2 IMPLANT
SOLUTION ANTFG W/FOAM PAD STRL (MISCELLANEOUS) ×2 IMPLANT
SOLUTION ELECTROSURG ANTI STCK (MISCELLANEOUS) ×2 IMPLANT
SPIKE FLUID TRANSFER (MISCELLANEOUS) ×2 IMPLANT
STAPLER 60 SUREFORM DVNC (STAPLE) ×2 IMPLANT
SUT MNCRL AB 4-0 PS2 18 (SUTURE) ×2 IMPLANT
SUT SILK 0 SH 30 (SUTURE) IMPLANT
SUT SILK 2 0 SH (SUTURE) ×4 IMPLANT
SUT VIC AB 2-0 SH 27XBRD (SUTURE) IMPLANT
SUT VICRYL 0 TIES 12 18 (SUTURE) ×2 IMPLANT
SUT VICRYL 0 UR6 27IN ABS (SUTURE) IMPLANT
SUTURE V-LC BRB 180 2/0GR6GS22 (SUTURE) ×4 IMPLANT
SUTURE VLOC BRB 180 ABS3/0GR12 (SUTURE) ×4 IMPLANT
SYR 20ML LL LF (SYRINGE) ×2 IMPLANT
TOWEL OR 17X26 10 PK STRL BLUE (TOWEL DISPOSABLE) ×2 IMPLANT
TRAY FOLEY MTR SLVR 16FR STAT (SET/KITS/TRAYS/PACK) IMPLANT
TROCAR Z-THREAD FIOS 5X100MM (TROCAR) ×2 IMPLANT

## 2023-07-22 NOTE — Progress Notes (Signed)
 PHARMACY CONSULT FOR:  Risk Assessment for Post-Discharge VTE Following Bariatric Surgery  Patient name:  [name]  Age (years):   63  BMI (kg/m2):   43  Procedure*:   Conversion, Revision  Procedure date:   07/22/2023  Procedure start:  0758  Procedure finish:  1132  Operation duration (min):   254    [    ]  Female  [    ]  Black race  [    ]  History of VTE requiring treatment*  [    ]  Hypercoagulable condition*  [    ]  Liver disorder*  [    ]  Pre-op venous stasis  [ X ]  Previous foregut or bariatric surgery  [    ]  Pre-op partially or fully dependent health status   [    ]  Post-op surgical site infection  [    ]  Transfusion intra- or post-op (up to 72 hr)*  [    ]  Unplanned readmission (within 30 days)  [    ]  Unplanned re-operation (within 30 days)  [    ]  Post-op GI perforation, leak, or obstruction*  *specific risk factors for portomesenteric venous thrombosis    Predicted probablility of 30-day post-discharge VTE*:    0.49%      MODERATE  *estimated using the [facility] Calculator    Other patient-specific factors to consider:  Inclusion of prior lap banding as a prior bariatric surgery does change the predicted risk (from 0.41% to 0.49%), but does not change the risk classification of MODERATE (0.4% - 1.0%); thus the recommendation below is valid in either case.    Recommendation for Discharge:  Enoxaparin 40 mg Hume q12 hr x 2 weeks     No Known Allergies  Patient Measurements: Height: 5' 0.5 (153.7 cm) Weight: 101.5 kg (223 lb 12.8 oz) IBW/kg (Calculated) : 46.65 Body mass index is 42.99 kg/m.  No results for input(s): WBC, HGB, HCT, PLT, APTT, CREATININE, LABCREA, CREAT24HRUR, MG, PHOS, ALBUMIN, PROT, AST, ALT, ALKPHOS, BILITOT, BILIDIR, IBILI in the last 72 hours. CrCl cannot be calculated (Patient's most recent lab result is older than the maximum 21 days allowed.).  Past Medical  History:  Diagnosis Date   Anxiety    Cough    Obesity    PONV (postoperative nausea and vomiting)    Unintentional weight loss     Medications Prior to Admission  Medication Sig Dispense Refill Last Dose/Taking   venlafaxine  (EFFEXOR ) 75 MG tablet TAKE 1 TABLET BY MOUTH TWICE A DAY (Patient taking differently: Take 75 mg by mouth 2 (two) times daily with a meal.) 180 tablet 0 07/22/2023 Morning     Bard Jeans, PharmD, BCPS [phone] 07/22/2023, 1:51 PM

## 2023-07-22 NOTE — Progress Notes (Signed)

## 2023-07-22 NOTE — Transfer of Care (Signed)
 Immediate Anesthesia Transfer of Care Note  Patient: [name]  Procedure(s) Performed: CREATION, GASTRIC BYPASS, ROUX-EN-Y, ROBOT-ASSISTED ENDOSCOPY, UPPER GI TRACT REMOVAL, GASTRIC BAND, LAPAROSCOPIC  Patient Location: PACU  Anesthesia Type:General  Level of Consciousness: awake, alert , and oriented  Airway & Oxygen Therapy: Patient Spontanous Breathing and Patient connected to face mask oxygen  Post-op Assessment: Report given to RN, Post -op Vital signs reviewed and stable, and Patient moving all extremities X 4  Post vital signs: Reviewed and stable  Last Vitals:  Vitals Value Taken Time  BP 163/75 07/22/23 11:45  Temp 36.4 C 07/22/23 11:44  Pulse 72 07/22/23 11:53  Resp 17 07/22/23 11:46  SpO2 100 % 07/22/23 11:53  Vitals shown include unfiled device data.  Last Pain:  Vitals:   07/22/23 1144  TempSrc:   PainSc: 0-No pain         Complications: No notable events documented.

## 2023-07-22 NOTE — Anesthesia Postprocedure Evaluation (Signed)
 Anesthesia Post Note  Patient: [name]  Procedure(s) Performed: CREATION, GASTRIC BYPASS, ROUX-EN-Y, ROBOT-ASSISTED ENDOSCOPY, UPPER GI TRACT REMOVAL, GASTRIC BAND, LAPAROSCOPIC     Patient location during evaluation: PACU Anesthesia Type: General Level of consciousness: awake and alert Pain management: pain level controlled Vital Signs Assessment: post-procedure vital signs reviewed and stable Respiratory status: spontaneous breathing, nonlabored ventilation, respiratory function stable and patient connected to nasal cannula oxygen Cardiovascular status: blood pressure returned to baseline and stable Postop Assessment: no apparent nausea or vomiting Anesthetic complications: no  No notable events documented.  Last Vitals:  Vitals:   07/22/23 1300 07/22/23 1315  BP: (!) 182/78   Pulse: 70 66  Resp:    Temp:    SpO2: 91% 97%    Last Pain:  Vitals:   07/22/23 1315  TempSrc:   PainSc: Asleep                 [name]

## 2023-07-22 NOTE — Op Note (Signed)
 Patient: [name] (Oct 15, 1960, [id number])  Date of Surgery: 07/22/2023  Preoperative Diagnosis: GERD, Obesity (BMI 42), History of adjustable laparoscopic gastric band  Postoperative Diagnosis: GERD, Obesity (BMI 42), History of adjustable laparoscopic gastric band  Surgical Procedure: CREATION, GASTRIC BYPASS, ROUX-EN-Y, ROBOT-ASSISTED  REMOVAL, GASTRIC BAND, LAPAROSCOPIC   ENDOSCOPY, UPPER GI TRACT  Operative Team Members:  Surgeons and Role:    * Kiefer Opheim, Deward PARAS, MD - Primary    * Kinsinger, Herlene Righter, MD   Anesthesiologist: Jefm Garnette LABOR, MD CRNA: Carleton Garnette SAUNDERS, CRNA; Brandy Almarie BROCKS, CRNA   Anesthesia: General   Fluids:  Total I/O In: 1500 [I.V.:1400; IV Piggyback:100] Out: -   Complications: * No complications entered in OR log *  Drains:  none   Specimen: None  Disposition:  PACU - hemodynamically stable.  Plan of Care: Admit to inpatient     Indications for Procedure:  Timira Bieda is a 63 y.o. female who underwent adjustable gastric band by Dr. Mikell in 2008. Recent upper GI x-ray shows the band nearly completely occludes the upper stomach and there is significant esophageal tortuosity and dilation above this obstruction. Discussed options moving forward. I offered to remove the Lap-Band and convert her to a sleeve or bypass. I explained my recommendation would be to convert to a gastric bypass as this is a better surgery when it comes to reflux, and I am afraid her lower esophageal sphincter function may be impaired due to her history of Lap-Band obstruction. I also discussed we could just remove the Lap-Band though this would be met with weight regain issues. She is interested in Lap-Band removal with conversion to a gastric bypass.   She has completed the preoperative pathway to include the following: - Bloodwork - Dietician consult - Chest x-ray - EKG - Psychology evaluation - Cardiology evaluation - Upper endoscopy with  biopsy. Negative for h. Pylori.   She presents today for a surgery. We discussed robotic lap band removal with conversion to gastric bypass. We discussed the procedure, its risks, benefits and alternatives. Risks discussed included but were not limited to leak, esophageal injury, bleeding, infection, damage to nearby structures, anesthesia complications, blood clots around the time of surgery. After a full discussion and all questions answered the patient granted consent to proceed. We will proceed as scheduled.    Findings: Lap band removed:   Infection status: Patient: Private Patient Elective Case Case: Elective Infection Present At Time Of Surgery (PATOS): Some spillage of foregut  and jejunal contents while creating anastomoses   Description of Procedure:   On the date stated above, the patient was taken to the operating room suite and placed in supine positioning.  General endotracheal anesthesia was induced.  A timeout was completed verifying the correct patient, procedure, positioning and equipment needed for the case.  The patient's abdomen was prepped and draped in the usual sterile fashion.  A 5 mm trocar was used to enter the right upper quadrant using optical technique.  The abdomen was entered safely without any trauma the underlying viscera.  Three additional incisions were made and 4 robotic trochars were placed across the abdomen, replacing the 5 mm trocar with the 12 mm robotic stapler trocar.  The [facility] liver retractor was placed through the subxiphoid region and under the left lobe of the liver and was connected to the rail of the bed.  A TAP block was placed using marcaine and Exparel under direct vision of the laparoscope.    I  first worked laparoscopically to free the Lap-Band.  The cicatrix around the band was divided using sharp dissection using the laparoscopic shears.  I was able to liberate the buccal and unbuckle the Lap-Band.  It was then able to be pulled out from  its position around the cardia of the stomach.  The tubing was divided so the Lap-Band could be removed through the 12 mm trocar site.  I then made a few centimeter incision over the port in the subcutaneous right upper quadrant.  I dissected free of the surrounding attachments using electrocautery and removed the port and tubing.  The port, tubing, and band were removed in their entirety and a photo was taken to document above.  A Vicryl suture was then placed transfascial a 2 close the tubing site.  The wound was then packed with gauze until the end of the case when it was closed with the other incisions.  The [employer] XI robotic platform was docked and we transitioned to robotic surgery.  I worked carefully to undo the gastric plication and free up the cicatrix surrounding the cardia of the stomach in order to clearly identify the angle of Hiss and GE junction.  I placed the adult upper endoscope during this portion of the procedure to examine the anatomy from inside the lumen.  The GE junction was identified.  The esophagus was massively dilated and tortuous.  The there was not a clear hiatal hernia, I decided to dissect the hiatus to ensure appropriate anatomy was identified and the GE junction was appropriately located inside the stomach.  Many adhesions between the Lap-Band cicatrix and the liver and diaphragm were divided during this portion of the dissection.  I was able to complete the dissection with the vessel sealer instrument.  The gastrohepatic ligament was divided and the right crus was identified.  A blunt dissection was carried out between the right crus and the esophagus.  The phrenoesophageal ligament was divided and dissection was carried up over the top of the esophagus towards the left crus.  The fundus was partially mobilized off of the left hemidiaphragm.     Beginning at the twelve o'clock position on the crus, the hernia sac was grasped and reduced form the mediastinum.  The hernia  sac was divided to enter the plane between the sac and the mediastinum.  The hernia sac was divided towards the left and right with continued traction on the hernia sac to reduce it.  A mediastinal dissection was performed to further reduce the hernia sac which facilitated reduction of the stomach as well.  The hernia sac was disconnected from the right and left crura and subsequently, the hernia sac was dissected from the stomach and esophagus, and excised.  All posterior gastric attachments to the lesser sac and retroperitoneum were divided.  The left crus was further delineated.  A retroesophageal window was created and used to for esophageal retraction.    A high, circumferential mediastinal dissection was performed in an effort to mobilize the esophagus and provide for adequate intraabdominal esophageal length.  The mediastinal dissection was performed bluntly, with little to no thermal energy.  The anterior and posterior vagus nerves were both identified and preserved.  The crural defect was reapproximated with multiple, interrupted, 0 silk sutures.  The crural pillars came together well without tearing of the adjacent diaphragmatic tissue.   At this point the anatomy was clear.  The cicatrix had been released and the stomach had been appropriately reoriented in the  abdomen.  The band appeared to had slid toward the GE junction and there was not a significant pouch above the band.  I decided to treat the pouch a little lower on the lesser curve in order to avoid creating an anastomosis right at the cicatrix from the Lap-Band.  In the end I think the pouch was appropriately sized, but is certainly a bit of a sleeve shaped pouch.  Using the tip up grasper, fenestrated bipolar, 30 degree camera and Vessel Sealer from the patient's right to left, we began by dissecting the angle of His off the left crus of the diaphragm.  The adhesions between the stomach, spleen and diaphragm were divided using the  Vessel Sealer to define the angle of His.  I then started 4-6 cm down on the lesser curve of the stomach and created a defect in the gastrohepatic ligament tracking behind the lesser curve of the stomach to enter the lesser sac.  I then used multiple white loads of the robotic 60 mm Sureform linear stapler to form the gastric pouch.  I then directed my attention to the lower abdomen.  The omentum was divided with the Vessel Sealer and I identified the ligament of treitz.  The jejunum was run to a point 50 cm from the ligament of Treitz.  This loop of bowel was then brought into the left upper quadrant, over the transverse colon, between the split omentum.  A 2-0 v-loc suture was used to create the posterior outer row of the gastrojejunal anastomosis.  An approximately 2 cm gastrotomy was made in the pouch and a matching 2 cm enterotomy was created in the roux limb.  Then, two 3-0 v-loc sutures were used to create a posterior, inner, full thickness layer of the anastomosis.  While sewing the anterior inner layer, the Ewald tube was passed through the anastomosis to ensure patency.  The outer, anterior layer was then created using 2-0 v-loc suture.  A window was made in the jejunal mesentery and the jejunum was divided just proximal to the gastrojejunal anastomosis using a white load of the robotic 60 mm Sureform linear stapler to divide the roux limb from the hepatobiliary limb.  At this point the gastrojejunal anastomosis was complete and the Ewald tube was removed.  The roux limb was then run 100 cm from the gastrojejunal anastomosis.  This loop of bowel was brought into the left upper quadrant for anastomosis to the hepatobiliary limb.  A robotic 60 mm white load on the Sureform linear stapler was introduced into both limbs and fired to create the jejunojejunostomy.  The common enterotomy was closed using 2-0 v-loc suture in connell fashion.  A 0-silk suture was placed along the staple line of the hepatobiliary  limb to the roux limb near the anastomosis to prevent obstruction.  The jejunojejunostomy mesenteric defect was closed using running 0-silk suture.   The retro-roux defect was closed, closing the roux limb mesentery to the transverse mesocolon mesentery using a running 0-Silk suture.   I ran the roux limb from the gastrojejunal anastomosis to the jejunojejunostomy and found the anatomy as expected without any twisting of the mesentery.  The intra-abdominal pressure was decreased to for the remainder of the case to check for hemostasis.  I then transitioned to endoscopic portion of the procedure.  The adult upper endoscope was inserted into the pouch.  The pouch appeared appropriately sized.  There was good intra-luminal hemostasis.  The endoscope was inserted through the anastomosis into  the roux limb.  The anastomosis appeared well formed without any stricture.  The anastomosis was submerged in irrigation in the left upper quadrant and there was no leakage of air bubbles with endoscopic insufflation suggesting a negative leak test and an air tight anastomosis.    The foregut was decompressed with the endoscope and the endoscope was removed.  The robotic instruments were removed and the robot was undocked.  The stapler port in the right abdomen was closed at the fascial level using 0-vicryl on a PMI suture passer.  The liver retractor was removed under direct vision.  The pneumoperitoneum was evacuated.  The skin was closed using 4-0 Monocryl and Dermabond.  All sponge and needle counts were correct at the end of the case.    Deward Foy, MD General, Bariatric, & Minimally Invasive Surgery [facility], [place]

## 2023-07-22 NOTE — H&P (Signed)
 Admitting Physician: Deward PARAS [place]  Service: Bariatric surgery  CC: Band malfunction  Subjective   HPI: [name] is an 63 y.o. female who is here for conversion from lap band to gastric bypass.  Past Medical History:  Diagnosis Date   Anxiety    Cough    Obesity    PONV (postoperative nausea and vomiting)    Unintentional weight loss     Past Surgical History:  Procedure Laterality Date   ABDOMINAL HYSTERECTOMY  01/29/2004   FIBROIDS   BIOPSY  02/18/2023   Procedure: BIOPSY;  Surgeon: Lyndel Deward PARAS, MD;  Location: [facility];  Service: General;;   CESAREAN SECTION  1992, 1994   COLONOSCOPY     ESOPHAGOGASTRODUODENOSCOPY N/A 02/18/2023   Procedure: ESOPHAGOGASTRODUODENOSCOPY (EGD);  Surgeon: Lyndel Deward PARAS, MD;  Location: [facility];  Service: General;  Laterality: N/A;   LAPAROSCOPIC GASTRIC BANDING  12/02/2006    Family History  Problem Relation Age of Onset   Diabetes Mother    Hypertension Mother    Hypertension Father    Stomach cancer Paternal Grandmother    Cancer Cousin        breast - mother's cousin   Colon cancer Neg Hx    Esophageal cancer Neg Hx    Rectal cancer Neg Hx     Social:  reports that she quit smoking about 30 years ago. Her smoking use included cigarettes. She has never used smokeless tobacco. She reports current alcohol use. She reports that she does not use drugs.  Allergies: No Known Allergies  Medications: Current Outpatient Medications  Medication Instructions   venlafaxine  (EFFEXOR ) 75 mg, Oral, 2 times daily    ROS - all of the below systems have been reviewed with the patient and positives are indicated with bold text General: chills, fever or night sweats Eyes: blurry vision or double vision ENT: epistaxis or sore throat Allergy/Immunology: itchy/watery eyes or nasal congestion Hematologic/Lymphatic: bleeding problems, blood clots or swollen lymph nodes Endocrine: temperature intolerance or  unexpected weight changes Breast: new or changing breast lumps or nipple discharge Resp: cough, shortness of breath, or wheezing CV: chest pain or dyspnea on exertion GI: as per HPI GU: dysuria, trouble voiding, or hematuria MSK: joint pain or joint stiffness Neuro: TIA or stroke symptoms Derm: pruritus and skin lesion changes Psych: anxiety and depression  Objective   PE Blood pressure (!) 142/66, pulse 74, temperature 98.5 F (36.9 C), temperature source Oral, resp. rate 16, height 5' 0.5 (1.537 m), weight 101.5 kg, SpO2 98%. Constitutional: NAD; conversant; no deformities Eyes: Moist conjunctiva; no lid lag; anicteric; PERRL Neck: Trachea midline; no thyromegaly Lungs: Normal respiratory effort; no tactile fremitus CV: RRR; no palpable thrills; no pitting edema GI: Abd Soft, nontender; no palpable hepatosplenomegaly MSK: Normal range of motion of extremities; no clubbing/cyanosis Psychiatric: Appropriate affect; alert and oriented x3 Lymphatic: No palpable cervical or axillary lymphadenopathy  No results found for this or any previous visit (from the past 24 hours).  Imaging Orders  No imaging studies ordered today     Assessment and Plan    [name] is a 63 y.o. female who underwent adjustable gastric band by Dr. Mikell in 2008. Recent upper GI x-ray shows the band nearly completely occludes the upper stomach and there is significant esophageal tortuosity and dilation above this obstruction. Discussed options moving forward. I offered to remove the Lap-Band and convert her to a sleeve or bypass. I explained my recommendation would be to convert  to a gastric bypass as this is a better surgery when it comes to reflux, and I am afraid her lower esophageal sphincter function may be impaired due to her history of Lap-Band obstruction. I also discussed we could just remove the Lap-Band though this would be met with weight regain issues. She is interested in Lap-Band removal  with conversion to a gastric bypass.   She has completed the preoperative pathway to include the following: - Bloodwork - Dietician consult - Chest x-ray - EKG - Psychology evaluation - Cardiology evaluation - Upper endoscopy with biopsy. Negative for h. Pylori.   She presents today for a surgery. We discussed robotic lap band removal with conversion to gastric bypass. We discussed the procedure, its risks, benefits and alternatives. Risks discussed included but were not limited to leak, esophageal injury, bleeding, infection, damage to nearby structures, anesthesia complications, blood clots around the time of surgery. After a full discussion and all questions answered the patient granted consent to proceed. We will proceed as scheduled.  Deward JINNY Foy, MD  [facility] Use [email or web address] to contact on call provider

## 2023-07-22 NOTE — Anesthesia Procedure Notes (Signed)
 Procedure Name: Intubation Date/Time: 07/22/2023 7:33 AM  Performed by: Brandy Almarie BROCKS, CRNAPre-anesthesia Checklist: Patient identified, Emergency Drugs available, Suction available and Patient being monitored Patient Re-evaluated:Patient Re-evaluated prior to induction Oxygen Delivery Method: Circle system utilized Preoxygenation: Pre-oxygenation with 100% oxygen Induction Type: IV induction Ventilation: Mask ventilation without difficulty and Oral airway inserted - appropriate to patient size Laryngoscope Size: Mac and 4 Grade View: Grade II Tube type: Oral Tube size: 7.0 mm Number of attempts: 1 Airway Equipment and Method: Stylet Placement Confirmation: ETT inserted through vocal cords under direct vision, positive ETCO2 and breath sounds checked- equal and bilateral Secured at: 22 cm Tube secured with: Tape Dental Injury: Teeth and Oropharynx as per pre-operative assessment

## 2023-07-23 ENCOUNTER — Other Ambulatory Visit (HOSPITAL_COMMUNITY): Payer: Self-pay

## 2023-07-23 ENCOUNTER — Encounter (HOSPITAL_COMMUNITY): Payer: Self-pay | Admitting: Surgery

## 2023-07-23 LAB — CBC WITH DIFFERENTIAL/PLATELET
Abs Immature Granulocytes: 0.06 10*3/uL (ref 0.00–0.07)
Basophils Absolute: 0 10*3/uL (ref 0.0–0.1)
Basophils Relative: 0 %
Eosinophils Absolute: 0 10*3/uL (ref 0.0–0.5)
Eosinophils Relative: 0 %
HCT: 45.3 % (ref 36.0–46.0)
Hemoglobin: 13.7 g/dL (ref 12.0–15.0)
Immature Granulocytes: 1 %
Lymphocytes Relative: 10 %
Lymphs Abs: 1.1 10*3/uL (ref 0.7–4.0)
MCH: 26.8 pg (ref 26.0–34.0)
MCHC: 30.2 g/dL (ref 30.0–36.0)
MCV: 88.5 fL (ref 80.0–100.0)
Monocytes Absolute: 1 10*3/uL (ref 0.1–1.0)
Monocytes Relative: 8 %
Neutro Abs: 9.6 10*3/uL — ABNORMAL HIGH (ref 1.7–7.7)
Neutrophils Relative %: 81 %
Platelets: 247 10*3/uL (ref 150–400)
RBC: 5.12 MIL/uL — ABNORMAL HIGH (ref 3.87–5.11)
RDW: 14.6 % (ref 11.5–15.5)
WBC: 11.8 10*3/uL — ABNORMAL HIGH (ref 4.0–10.5)
nRBC: 0 % (ref 0.0–0.2)

## 2023-07-23 LAB — CREATININE, SERUM
Creatinine, Ser: 0.67 mg/dL (ref 0.44–1.00)
GFR, Estimated: >60 mL/min (ref >60)

## 2023-07-23 MED ORDER — ENOXAPARIN SODIUM 40 MG/0.4ML IJ SOSY: 40.0000 mg | PREFILLED_SYRINGE | Freq: Two times a day (BID) | INTRAMUSCULAR | 0 refills | Status: DC

## 2023-07-23 MED ORDER — PANTOPRAZOLE SODIUM 40 MG PO TBEC: 40.0000 mg | DELAYED_RELEASE_TABLET | Freq: Every day | ORAL | 0 refills | Status: AC

## 2023-07-23 MED ORDER — OXYCODONE HCL 5 MG PO TABS: 5.0000 mg | ORAL_TABLET | Freq: Four times a day (QID) | ORAL | 0 refills | Status: DC | PRN

## 2023-07-23 MED ORDER — GABAPENTIN 100 MG PO CAPS: 100.0000 mg | ORAL_CAPSULE | Freq: Two times a day (BID) | ORAL | 0 refills | Status: DC

## 2023-07-23 MED ORDER — ONDANSETRON 4 MG PO TBDP: 4.0000 mg | ORAL_TABLET | Freq: Four times a day (QID) | ORAL | 0 refills | Status: DC | PRN

## 2023-07-23 MED ORDER — ACETAMINOPHEN 500 MG PO TABS: 1000.0000 mg | ORAL_TABLET | Freq: Three times a day (TID) | ORAL | Status: AC

## 2023-07-23 MED ORDER — ENOXAPARIN (LOVENOX) PATIENT EDUCATION KIT: 1.0000 | PACK | Freq: Once | 0 refills | Status: AC

## 2023-07-23 NOTE — Progress Notes (Signed)
 Patient alert and oriented, pain is controlled. Patient is tolerating fluids, advanced to protein shake today, patient is tolerating well. Reviewed Gastric sleeve/bypass discharge instructions with patient and patient is able to articulate understanding. Provided information on BELT program, Support Group, BSTOP-D, and [facility] outpatient pharmacy. Communicated general update of patient status to surgeon. All questions answered. 24hr fluid recall is 370 mL per hydration protocol, bariatric nurse coordinator to make follow-up phone call within one week.

## 2023-07-23 NOTE — Progress Notes (Signed)
   07/23/23 0905  TOC Brief Assessment  Insurance and Status Reviewed  Patient has primary care physician Yes  Home environment has been reviewed home w/ spouse  Prior level of function: independent  Prior/Current Home Services No current home services  Social Drivers of Health Review SDOH reviewed no interventions necessary  Readmission risk has been reviewed Yes  Transition of care needs no transition of care needs at this time

## 2023-07-23 NOTE — Discharge Summary (Signed)
 Patient ID: [name] [id number] 63 y.o. May 08, 1960  07/22/2023  Discharge date and time: 07/23/2023  Admitting Physician: Deward PARAS Mecca Barga  Discharge Physician: Deward PARAS Emily Forse  Admission Diagnoses: Class 2 severe obesity due to excess calories with serious comorbidity and body mass index (BMI) of 38.0 to 38.9 in adult [facility]) [Z33.187, E66.01, Z68.38] History of laparoscopic adjustable gastric banding [Z98.84] Gastroesophageal reflux disease with esophagitis, unspecified whether hemorrhage [K21.00] Esophageal spasm [K22.4] Congenital stenosis of esophagus [Q39.3] Dyskinesia of esophagus [K22.4] Morbid obesity with BMI of 40.0-44.9, adult (HCC) [E66.01, Z68.41] Patient Active Problem List   Diagnosis Date Noted   Morbid obesity with BMI of 40.0-44.9, adult (HCC) 07/22/2023   Esophageal spasm 08/23/2022   Obesity (BMI 35.0-39.9 without comorbidity) 08/23/2022   Acute non-recurrent maxillary sinusitis 01/22/2022   Acute cough 01/22/2022   Pes anserine bursitis 12/17/2021   Mixed stress and urge urinary incontinence 10/15/2021   Osteopenia after menopause 10/15/2021   Anxiety      Discharge Diagnoses:  Patient Active Problem List   Diagnosis Date Noted   Morbid obesity with BMI of 40.0-44.9, adult (HCC) 07/22/2023   Esophageal spasm 08/23/2022   Obesity (BMI 35.0-39.9 without comorbidity) 08/23/2022   Acute non-recurrent maxillary sinusitis 01/22/2022   Acute cough 01/22/2022   Pes anserine bursitis 12/17/2021   Mixed stress and urge urinary incontinence 10/15/2021   Osteopenia after menopause 10/15/2021   Anxiety     Operations: Procedure(s): CREATION, GASTRIC BYPASS, ROUX-EN-Y, ROBOT-ASSISTED ENDOSCOPY, UPPER GI TRACT REMOVAL, GASTRIC BAND, LAPAROSCOPIC  Admission Condition: good  Discharged Condition: good  Indication for Admission: GERD, Obesity (BMI 42), History of adjustable laparoscopic gastric band   Hospital Course:  CREATION, GASTRIC BYPASS,  ROUX-EN-Y, ROBOT-ASSISTED   REMOVAL, GASTRIC BAND, LAPAROSCOPIC   ENDOSCOPY, UPPER GI TRACT  Consults: None  Significant Diagnostic Studies: None  Treatments: surgery: as above  Disposition: Home  Patient Instructions:  Allergies as of 07/23/2023   No Known Allergies      Medication List     TAKE these medications    acetaminophen 500 MG tablet Commonly known as: TYLENOL Take 2 tablets (1,000 mg total) by mouth every 8 (eight) hours for 5 days.   enoxaparin 40 MG/0.4ML injection Commonly known as: LOVENOX Inject 0.4 mLs (40 mg total) into the skin every 12 (twelve) hours for 14 days.   enoxaparin Kit Commonly known as: LOVENOX 1 kit by Does not apply route once for 1 dose.   gabapentin 100 MG capsule Commonly known as: NEURONTIN Take 1 capsule (100 mg total) by mouth every 12 (twelve) hours for 5 days.   ondansetron 4 MG disintegrating tablet Commonly known as: ZOFRAN-ODT Take 1 tablet (4 mg total) by mouth every 6 (six) hours as needed for nausea or vomiting.   oxyCODONE 5 MG immediate release tablet Commonly known as: Oxy IR/ROXICODONE Take 1 tablet (5 mg total) by mouth every 6 (six) hours as needed for breakthrough pain or severe pain (pain score 7-10).   pantoprazole 40 MG tablet Commonly known as: PROTONIX Take 1 tablet (40 mg total) by mouth daily.   venlafaxine  75 MG tablet Commonly known as: EFFEXOR  TAKE 1 TABLET BY MOUTH TWICE A DAY What changed: when to take this        Activity: no heavy lifting for 4 weeks Diet: Bariatric Wound Care: keep wound clean and dry  Follow-up:  With Dr. Lyndel Signed: Deward PARAS Senetra Dillin General, Bariatric, & Minimally Invasive Surgery [facility], [place]   07/23/2023, 9:19  AM

## 2023-07-24 ENCOUNTER — Other Ambulatory Visit (HOSPITAL_COMMUNITY): Payer: Self-pay

## 2023-07-30 ENCOUNTER — Telehealth (HOSPITAL_COMMUNITY): Payer: Self-pay | Admitting: *Deleted

## 2023-07-30 NOTE — Telephone Encounter (Signed)
 1. Tell me about your pain and pain management?     Pt denies any pain.   2. Let's talk about fluid intake. How much total fluid are you taking in?   Pt states that s/he is working to meet her goal of 64 oz of fluid today. Pt encouraged to continue to work towards meeting goal. Pt instructed to assess status and suggestions daily utilizing Hydration Action Plan on discharge folder and to call [facility] if in the red zone.     3. How much protein have you taken in the last day?     Pt states she is meeting the goal of 60g of protein each day with the protein shakes     4. Have you had nausea? Tell me about when you have experienced nausea and what you did to help?   Pt denies nausea.   5. Has the frequency or color changed with your urine?   Pt states that s/he is urinating fine with no changes in frequency or urgency.   6. Tell me what your incisions look like?   Incisions look fine. Pt denies a fever, chills. Pt states incisions are not swollen, open, or draining. Pt encouraged to call [facility] if incisions change.    7. Have you been passing gas? BM?   Pt states that they are having BMs.   Pt states that they have had a BM. Pt instructed to take either Miralax  or MoM as instructed per Gastric [facility] Discharge Home Care Instructions. Pt to call surgeon's office if not able to have BM with medication.    8. If a problem or question were to arise who would you call? Do you know contact numbers for [facility], [facility], and [facility]?   Pt knows to call [facility] for surgical, [facility] for nutrition, and [facility] for non-urgent questions or concerns. Pt denies dehydration symptoms. Pt can describe s/sx of dehydration.   9. How has the walking going?   Pt states s/he is walking around and able to be active without difficulty.   10.  No reports of breathing concerns or difficulties  11. How are your vitamins and calcium going? How are you taking them?     Pt states that s/he is taking his/her  supplements and vitamins without difficulty.

## 2023-08-05 ENCOUNTER — Encounter: Attending: Surgery | Admitting: Dietician

## 2023-08-05 ENCOUNTER — Encounter: Payer: Self-pay | Admitting: Dietician

## 2023-08-05 VITALS — Ht 64.0 in | Wt 218.0 lb

## 2023-08-05 DIAGNOSIS — E669 Obesity, unspecified: Secondary | ICD-10-CM | POA: Insufficient documentation

## 2023-08-05 NOTE — Progress Notes (Signed)
 2 Week Post-Operative Nutrition Class   Patient was seen on 08/05/2023 for Post-Operative Nutrition education at the [facility].    Surgery date: 07/22/2023 Surgery type: RYGB  Anthropometrics  Start weight at [facility]: 227.0 lbs (date: 01/03/2023)  Height: 64 in Weight today: 218.0   Clinical   Pharmacotherapy: History of weight loss medication used: phentermine  (not currently)  Medical hx: obesity, vit D, vit C Medications: efferox   Labs: Vit D 27.9; iron saturation 8 Notable signs/symptoms: none noted Any previous deficiencies? No Bowel Habits: Every day to every other day no complaints   Body Composition Scale 08/05/2022  Current Body Weight 218.0  Total Body Fat % 44.0  Visceral Fat 16  Fat-Free Mass % 55.9   Total Body Water  % 42.4  Muscle-Mass lbs 29.0  BMI 37.3  Body Fat Displacement          Torso  lbs 59.5         Left Leg  lbs 11.9         Right Leg  lbs 11.9         Left Arm  lbs 5.9         Right Arm  lbs 5.9      The following the learning objectives were met by the patient during this course: Identifies Soft Prepped Plan Advancement Guide  Identifies Soft, High Proteins (Phase 1), beginning 2 weeks post-operatively to 3 weeks post-operatively Identifies Additional Soft High Proteins, soft non-starchy vegetables, fruits and starches (Phase 2), beginning 3 weeks post-operatively to 3 months post-operatively Identifies appropriate sources of fluids, proteins, vegetables, fruits and starches Identifies appropriate fat sources and healthy verses unhealthy fat types   States protein, vegetable, fruit and starch recommendations and appropriate sources post-operatively Identifies the need for appropriate texture modifications, mastication, and bite sizes when consuming solids Identifies appropriate fat consumption and sources Identifies appropriate multivitamin and calcium sources post-operatively Describes the need for physical  activity post-operatively and will follow MD recommendations States when to call healthcare provider regarding medication questions or post-operative complications   Handouts given during class include: Soft Prepped Plan Advancement Guide   Follow-Up Plan: Patient will follow-up at [facility] in 10 weeks for 3 month post-op nutrition visit for diet advancement per MD.

## 2023-08-14 ENCOUNTER — Telehealth: Payer: Self-pay | Admitting: Dietician

## 2023-08-14 DIAGNOSIS — E669 Obesity, unspecified: Secondary | ICD-10-CM

## 2023-08-14 NOTE — Telephone Encounter (Signed)
 RD called pt to verify fluid intake once starting soft, solid proteins 2 week post-bariatric surgery.   Daily Fluid intake: 64 oz Daily Protein intake: 60 grams Bowel Habits: no issues  Concerns/issues: everything is going great, per patient

## 2023-10-17 ENCOUNTER — Ambulatory Visit: Admitting: Dietician

## 2023-11-25 ENCOUNTER — Other Ambulatory Visit: Payer: Self-pay | Admitting: Family Medicine

## 2023-11-25 DIAGNOSIS — Z8659 Personal history of other mental and behavioral disorders: Secondary | ICD-10-CM

## 2023-11-25 NOTE — Telephone Encounter (Signed)
 Pls contact pt to schedule Effexor  refill with Dr. Alvia. Last non-acute visit in 2024.

## 2023-11-28 ENCOUNTER — Ambulatory Visit: Admitting: Family Medicine

## 2023-12-05 ENCOUNTER — Other Ambulatory Visit: Payer: Self-pay | Admitting: Family Medicine

## 2023-12-05 DIAGNOSIS — Z1231 Encounter for screening mammogram for malignant neoplasm of breast: Secondary | ICD-10-CM

## 2023-12-09 ENCOUNTER — Encounter: Payer: Self-pay | Admitting: Family Medicine

## 2023-12-09 ENCOUNTER — Ambulatory Visit: Admitting: Family Medicine

## 2023-12-09 VITALS — BP 130/73 | HR 74 | Ht 64.0 in | Wt 193.1 lb

## 2023-12-09 DIAGNOSIS — F419 Anxiety disorder, unspecified: Secondary | ICD-10-CM | POA: Diagnosis not present

## 2023-12-09 DIAGNOSIS — Z23 Encounter for immunization: Secondary | ICD-10-CM | POA: Diagnosis not present

## 2023-12-09 DIAGNOSIS — Z8659 Personal history of other mental and behavioral disorders: Secondary | ICD-10-CM

## 2023-12-09 DIAGNOSIS — Z6841 Body Mass Index (BMI) 40.0 and over, adult: Secondary | ICD-10-CM | POA: Diagnosis not present

## 2023-12-09 MED ORDER — VENLAFAXINE HCL 75 MG PO TABS: 75.0000 mg | ORAL_TABLET | Freq: Two times a day (BID) | ORAL | 3 refills | Status: AC

## 2023-12-09 MED ORDER — PHENTERMINE HCL 37.5 MG PO CAPS
ORAL_CAPSULE | ORAL | 2 refills | Status: AC
Start: 2023-12-09 — End: ?

## 2023-12-09 NOTE — Progress Notes (Signed)
 [name] - 63 y.o. female MRN [id number]  Date of birth: Jun 06, 1960  Subjective Chief Complaint  Patient presents with  . Medical Management of Chronic Issues    HPI [name] is a 63 y.o. female here today for follow up visit.   She reports that she is doing well.  She remains on effexor  for anxiety and feels that this continues to work well for her.  She has not had side effects from this at current strength.  She would like to continue on this.    She continues to have some difficulty with weight loss.  She would like to try medication to help with this.  She has made changes to her diet and feels like she is fairly active.  GLP-1 was not covered previously.  ROS:  A comprehensive ROS was completed and negative except as noted per HPI  No Known Allergies  Past Medical History:  Diagnosis Date  . Anxiety   . Cough   . Obesity   . PONV (postoperative nausea and vomiting)   . Unintentional weight loss     Past Surgical History:  Procedure Laterality Date  . ABDOMINAL HYSTERECTOMY  01/29/2004   FIBROIDS  . BIOPSY  02/18/2023   Procedure: BIOPSY;  Surgeon: Lyndel Deward PARAS, MD;  Location: [facility];  Service: General;;  . CESAREAN SECTION  1992, 9  . COLONOSCOPY    . CREATION, GASTRIC BYPASS, ROUX-EN-Y, ROBOT-ASSISTED N/A 07/22/2023   Procedure: CREATION, GASTRIC BYPASS, ROUX-EN-Y, ROBOT-ASSISTED;  Surgeon: Lyndel Deward PARAS, MD;  Location: [facility];  Service: General;  Laterality: N/A;  . ESOPHAGOGASTRODUODENOSCOPY N/A 02/18/2023   Procedure: ESOPHAGOGASTRODUODENOSCOPY (EGD);  Surgeon: Lyndel Deward PARAS, MD;  Location: [facility];  Service: General;  Laterality: N/A;  . LAPAROSCOPIC GASTRIC BANDING  12/02/2006  . UPPER GI ENDOSCOPY N/A 07/22/2023   Procedure: ENDOSCOPY, UPPER GI TRACT;  Surgeon: Lyndel Deward PARAS, MD;  Location: [facility];  Service: General;  Laterality: N/A;    Social History   Socioeconomic History  . Marital status: Married     Spouse name: Not on file  . Number of children: 2  . Years of education: Not on file  . Highest education level: Some college, no degree  Occupational History  . Occupation: sheets  Tobacco Use  . Smoking status: Former    Current packs/day: 0.00    Types: Cigarettes    Quit date: 04/02/1993    Years since quitting: 30.7  . Smokeless tobacco: Never  Vaping Use  . Vaping status: Never Used  Substance and Sexual Activity  . Alcohol use: Yes    Comment: Rare  . Drug use: No  . Sexual activity: Yes    Birth control/protection: None    Comment: intercourse age 19,more than 5 sexual partners,des neg  Other Topics Concern  . Not on file  Social History Narrative  . Not on file   Social Drivers of Health   Financial Resource Strain: Low Risk  (12/09/2023)   Overall Financial Resource Strain (CARDIA)   . Difficulty of Paying Living Expenses: Not hard at all  Food Insecurity: No Food Insecurity (12/09/2023)   Hunger Vital Sign   . Worried About Programme Researcher, Broadcasting/film/video in the Last Year: Never true   . Ran Out of Food in the Last Year: Never true  Transportation Needs: No Transportation Needs (12/09/2023)   PRAPARE - Transportation   . Lack of Transportation (Medical): No   . Lack of Transportation (Non-Medical): No  Physical Activity: Inactive (12/09/2023)   Exercise Vital Sign   . Days of Exercise per Week: 0 days   . Minutes of Exercise per Session: Not on file  Stress: No Stress Concern Present (12/09/2023)   [employer] of Occupational Health - Occupational Stress Questionnaire   . Feeling of Stress: Only a little  Social Connections: Moderately Integrated (12/09/2023)   Social Connection and Isolation Panel   . Frequency of Communication with Friends and Family: More than three times a week   . Frequency of Social Gatherings with Friends and Family: Three times a week   . Attends Religious Services: Never   . Active Member of Clubs or Organizations: Yes   . Attends  Banker Meetings: 1 to 4 times per year   . Marital Status: Married    Family History  Problem Relation Age of Onset  . Diabetes Mother   . Hypertension Mother   . Hypertension Father   . Stomach cancer Paternal Grandmother   . Cancer Cousin        breast - mother's cousin  . Colon cancer Neg Hx   . Esophageal cancer Neg Hx   . Rectal cancer Neg Hx     Health Maintenance  Topic Date Due  . HIV Screening  Never done  . Hepatitis C Screening  Never done  . Zoster Vaccines- Shingrix (1 of 2) Never done  . Cervical Cancer Screening (HPV/Pap Cotest)  Never done  . Pneumococcal Vaccine: 50+ Years (1 of 1 - PCV) Never done  . COVID-19 Vaccine (3 - Pfizer risk series) 07/12/2019  . Mammogram  12/24/2024  . DTaP/Tdap/Td (2 - Td or Tdap) 06/11/2026  . Colonoscopy  08/13/2026  . Influenza Vaccine  Completed  . Hepatitis B Vaccines 19-59 Average Risk  Aged Out  . HPV VACCINES  Aged Out  . Meningococcal B Vaccine  Aged Out     ----------------------------------------------------------------------------------------------------------------------------------------------------------------------------------------------------------------- Physical Exam BP 130/73   Pulse 74   Ht 5' 4 (1.626 m)   Wt 193 lb 1.9 oz (87.6 kg)   SpO2 97%   BMI 33.15 kg/m   Physical Exam Constitutional:      Appearance: Normal appearance.  Eyes:     General: No scleral icterus. Pulmonary:     Effort: Pulmonary effort is normal.     Breath sounds: Normal breath sounds.  Musculoskeletal:     Cervical back: Neck supple.  Neurological:     Mental Status: She is alert.  Psychiatric:        Mood and Affect: Mood normal.        Behavior: Behavior normal.     ------------------------------------------------------------------------------------------------------------------------------------------------------------------------------------------------------------------- Assessment and  Plan  Morbid obesity with BMI of 40.0-44.9, adult (HCC) We discussed additional short-term appetite suppression such as phentermine .  Discussed importance of making dietary changes to help with continued weight loss after discontinuation of medication.  Anxiety Anxiety is stable with Effexor .  Will plan to continue this at current strength.   Meds ordered this encounter  Medications  . venlafaxine  (EFFEXOR ) 75 MG tablet    Sig: Take 1 tablet (75 mg total) by mouth 2 (two) times daily with a meal.    Dispense:  180 tablet    Refill:  3  . phentermine  37.5 MG capsule    Sig: One capsule by mouth qAM    Dispense:  30 capsule    Refill:  2    Return in about 1 year (around 12/08/2024) for Mood/BH.

## 2023-12-09 NOTE — Assessment & Plan Note (Signed)
 We discussed additional short-term appetite suppression such as phentermine .  Discussed importance of making dietary changes to help with continued weight loss after discontinuation of medication.

## 2023-12-09 NOTE — Assessment & Plan Note (Signed)
 Anxiety is stable with Effexor .  Will plan to continue this at current strength.

## 2023-12-29 ENCOUNTER — Encounter (HOSPITAL_BASED_OUTPATIENT_CLINIC_OR_DEPARTMENT_OTHER): Payer: Self-pay

## 2023-12-29 ENCOUNTER — Ambulatory Visit (HOSPITAL_BASED_OUTPATIENT_CLINIC_OR_DEPARTMENT_OTHER)
Admission: RE | Admit: 2023-12-29 | Discharge: 2023-12-29 | Disposition: A | Source: Ambulatory Visit | Attending: Family Medicine | Admitting: Family Medicine

## 2023-12-29 DIAGNOSIS — Z1231 Encounter for screening mammogram for malignant neoplasm of breast: Secondary | ICD-10-CM | POA: Diagnosis not present

## 2024-01-08 ENCOUNTER — Ambulatory Visit: Payer: Self-pay | Admitting: Family Medicine

## 2024-01-08 DIAGNOSIS — R928 Other abnormal and inconclusive findings on diagnostic imaging of breast: Secondary | ICD-10-CM

## 2024-01-09 ENCOUNTER — Other Ambulatory Visit: Payer: Self-pay | Admitting: Family Medicine

## 2024-01-16 ENCOUNTER — Other Ambulatory Visit: Payer: Self-pay | Admitting: Family Medicine

## 2024-01-28 ENCOUNTER — Ambulatory Visit
Admission: RE | Admit: 2024-01-28 | Discharge: 2024-01-28 | Disposition: A | Discharge status: Home / Self Care | Source: Ambulatory Visit | Attending: Family Medicine | Admitting: Family Medicine

## 2024-01-28 ENCOUNTER — Ambulatory Visit

## 2024-01-28 DIAGNOSIS — R928 Other abnormal and inconclusive findings on diagnostic imaging of breast: Secondary | ICD-10-CM
